# Patient Record
Sex: Male | Born: 1965 | Race: Black or African American | Hispanic: No | Marital: Married | Smoking: Never smoker
Health system: Southern US, Community
[De-identification: ages and names within clinical notes are randomized; demographics above are authoritative.]

## PROBLEM LIST (undated history)

## (undated) DIAGNOSIS — I10 Essential (primary) hypertension: Secondary | ICD-10-CM

## (undated) DIAGNOSIS — E7800 Pure hypercholesterolemia, unspecified: Secondary | ICD-10-CM

## (undated) DIAGNOSIS — E119 Type 2 diabetes mellitus without complications: Secondary | ICD-10-CM

## (undated) HISTORY — PX: TONSILLECTOMY: SUR1361

## (undated) HISTORY — PX: BACK SURGERY: SHX140

## (undated) HISTORY — PX: OTHER SURGICAL HISTORY: SHX169

---

## 2005-05-05 ENCOUNTER — Emergency Department (HOSPITAL_COMMUNITY): Admission: EM | Admit: 2005-05-05 | Discharge: 2005-05-05 | Payer: Self-pay | Admitting: Emergency Medicine

## 2007-09-23 ENCOUNTER — Emergency Department (HOSPITAL_COMMUNITY): Admission: EM | Admit: 2007-09-23 | Discharge: 2007-09-23 | Payer: Self-pay | Admitting: Emergency Medicine

## 2008-04-11 ENCOUNTER — Emergency Department (HOSPITAL_COMMUNITY): Admission: EM | Admit: 2008-04-11 | Discharge: 2008-04-11 | Payer: Self-pay | Admitting: Emergency Medicine

## 2009-09-09 ENCOUNTER — Emergency Department (HOSPITAL_COMMUNITY): Admission: EM | Admit: 2009-09-09 | Discharge: 2009-09-10 | Payer: Self-pay | Admitting: Emergency Medicine

## 2009-09-09 ENCOUNTER — Emergency Department (HOSPITAL_COMMUNITY): Admission: EM | Admit: 2009-09-09 | Discharge: 2009-09-09 | Payer: Self-pay | Admitting: Emergency Medicine

## 2010-12-24 LAB — DIFFERENTIAL
Basophils Absolute: 0.1 10*3/uL (ref 0.0–0.1)
Basophils Relative: 1 % (ref 0–1)
Eosinophils Relative: 2 % (ref 0–5)
Lymphs Abs: 2.6 10*3/uL (ref 0.7–4.0)
Monocytes Absolute: 0.6 10*3/uL (ref 0.1–1.0)
Monocytes Relative: 6 % (ref 3–12)
Neutro Abs: 6.6 10*3/uL (ref 1.7–7.7)
Neutrophils Relative %: 66 % (ref 43–77)

## 2010-12-24 LAB — CBC
MCV: 89.1 fL (ref 78.0–100.0)
Platelets: 214 10*3/uL (ref 150–400)
RBC: 5.11 MIL/uL (ref 4.22–5.81)

## 2010-12-24 LAB — URINALYSIS, ROUTINE W REFLEX MICROSCOPIC
Bilirubin Urine: NEGATIVE
Glucose, UA: NEGATIVE mg/dL
Hgb urine dipstick: NEGATIVE
Ketones, ur: NEGATIVE mg/dL
Urobilinogen, UA: 1 mg/dL (ref 0.0–1.0)
pH: 6 (ref 5.0–8.0)

## 2010-12-24 LAB — COMPREHENSIVE METABOLIC PANEL
Chloride: 109 mEq/L (ref 96–112)
GFR calc non Af Amer: >60 mL/min (ref >60)
Total Bilirubin: 0.7 mg/dL (ref 0.3–1.2)

## 2010-12-24 LAB — URINE CULTURE: Colony Count: NO GROWTH

## 2011-02-05 ENCOUNTER — Emergency Department (HOSPITAL_COMMUNITY)
Admission: EM | Admit: 2011-02-05 | Discharge: 2011-02-06 | Disposition: A | Discharge status: Home / Self Care | Payer: Medicare Other | Attending: Emergency Medicine | Admitting: Emergency Medicine

## 2011-02-05 ENCOUNTER — Emergency Department (HOSPITAL_COMMUNITY): Payer: Medicare Other

## 2011-02-05 DIAGNOSIS — Z7982 Long term (current) use of aspirin: Secondary | ICD-10-CM | POA: Insufficient documentation

## 2011-02-05 DIAGNOSIS — Z79899 Other long term (current) drug therapy: Secondary | ICD-10-CM | POA: Insufficient documentation

## 2011-02-05 DIAGNOSIS — R0989 Other specified symptoms and signs involving the circulatory and respiratory systems: Secondary | ICD-10-CM | POA: Insufficient documentation

## 2011-02-05 DIAGNOSIS — R0609 Other forms of dyspnea: Secondary | ICD-10-CM | POA: Insufficient documentation

## 2011-02-05 DIAGNOSIS — R079 Chest pain, unspecified: Secondary | ICD-10-CM | POA: Insufficient documentation

## 2011-02-05 DIAGNOSIS — E119 Type 2 diabetes mellitus without complications: Secondary | ICD-10-CM | POA: Insufficient documentation

## 2011-02-05 DIAGNOSIS — I10 Essential (primary) hypertension: Secondary | ICD-10-CM | POA: Insufficient documentation

## 2011-02-05 DIAGNOSIS — R1013 Epigastric pain: Secondary | ICD-10-CM | POA: Insufficient documentation

## 2011-02-05 DIAGNOSIS — E7800 Pure hypercholesterolemia, unspecified: Secondary | ICD-10-CM | POA: Insufficient documentation

## 2011-02-05 DIAGNOSIS — R911 Solitary pulmonary nodule: Secondary | ICD-10-CM | POA: Insufficient documentation

## 2011-02-05 LAB — COMPREHENSIVE METABOLIC PANEL
ALT: 40 U/L (ref 0–53)
AST: 26 U/L (ref 0–37)
Albumin: 4.3 g/dL (ref 3.5–5.2)
Alkaline Phosphatase: 103 U/L (ref 39–117)
CO2: 25 mEq/L (ref 19–32)
Calcium: 9 mg/dL (ref 8.4–10.5)
Chloride: 94 mEq/L — ABNORMAL LOW (ref 96–112)
GFR calc Af Amer: >60 mL/min (ref >60)
GFR calc non Af Amer: >60 mL/min (ref >60)
Sodium: 131 mEq/L — ABNORMAL LOW (ref 135–145)
Total Bilirubin: 0.5 mg/dL (ref 0.3–1.2)

## 2011-02-05 LAB — POCT CARDIAC MARKERS
Myoglobin, poc: 104 ng/mL (ref 12–200)
Troponin i, poc: <0.05 ng/mL (ref 0.00–0.09)
Troponin i, poc: <0.05 ng/mL (ref 0.00–0.09)

## 2011-02-05 LAB — CBC
Hemoglobin: 16.4 g/dL (ref 13.0–17.0)
MCH: 29.2 pg (ref 26.0–34.0)
MCV: 81.5 fL (ref 78.0–100.0)

## 2011-02-05 LAB — DIFFERENTIAL
Basophils Absolute: 0 10*3/uL (ref 0.0–0.1)
Monocytes Absolute: 0.7 10*3/uL (ref 0.1–1.0)
Neutro Abs: 8 10*3/uL — ABNORMAL HIGH (ref 1.7–7.7)

## 2011-02-05 LAB — GLUCOSE, CAPILLARY: Glucose-Capillary: 404 mg/dL — ABNORMAL HIGH (ref 70–99)

## 2011-02-05 MED ORDER — IOHEXOL 300 MG/ML  SOLN
100.0000 mL | Freq: Once | INTRAMUSCULAR | Status: AC | PRN
  Administered 2011-02-05: 100 mL via INTRAVENOUS

## 2011-02-06 ENCOUNTER — Emergency Department (HOSPITAL_COMMUNITY)
Admission: EM | Admit: 2011-02-06 | Discharge: 2011-02-06 | Disposition: A | Discharge status: Home / Self Care | Payer: Medicare Other | Attending: Emergency Medicine | Admitting: Emergency Medicine

## 2011-02-06 DIAGNOSIS — R631 Polydipsia: Secondary | ICD-10-CM | POA: Insufficient documentation

## 2011-02-06 DIAGNOSIS — R358 Other polyuria: Secondary | ICD-10-CM | POA: Insufficient documentation

## 2011-02-06 DIAGNOSIS — R112 Nausea with vomiting, unspecified: Secondary | ICD-10-CM | POA: Insufficient documentation

## 2011-02-06 DIAGNOSIS — R7309 Other abnormal glucose: Secondary | ICD-10-CM | POA: Insufficient documentation

## 2011-02-06 DIAGNOSIS — E7800 Pure hypercholesterolemia, unspecified: Secondary | ICD-10-CM | POA: Insufficient documentation

## 2011-02-06 DIAGNOSIS — R3589 Other polyuria: Secondary | ICD-10-CM | POA: Insufficient documentation

## 2011-02-06 DIAGNOSIS — E119 Type 2 diabetes mellitus without complications: Secondary | ICD-10-CM | POA: Insufficient documentation

## 2011-02-06 DIAGNOSIS — I10 Essential (primary) hypertension: Secondary | ICD-10-CM | POA: Insufficient documentation

## 2011-02-06 LAB — URINALYSIS, ROUTINE W REFLEX MICROSCOPIC
Bilirubin Urine: NEGATIVE
Hgb urine dipstick: NEGATIVE
Hgb urine dipstick: NEGATIVE
Ketones, ur: 15 mg/dL — AB
Nitrite: NEGATIVE
Protein, ur: NEGATIVE mg/dL
Protein, ur: NEGATIVE mg/dL
Specific Gravity, Urine: >1.046 — ABNORMAL HIGH (ref 1.005–1.030)
Specific Gravity, Urine: 1.038 — ABNORMAL HIGH (ref 1.005–1.030)
Urobilinogen, UA: 0.2 mg/dL (ref 0.0–1.0)
Urobilinogen, UA: 1 mg/dL (ref 0.0–1.0)
pH: 5.5 (ref 5.0–8.0)

## 2011-02-06 LAB — DIFFERENTIAL
Lymphs Abs: 2.4 10*3/uL (ref 0.7–4.0)
Monocytes Absolute: 0.6 10*3/uL (ref 0.1–1.0)
Monocytes Relative: 7 % (ref 3–12)
Neutro Abs: 5.1 10*3/uL (ref 1.7–7.7)
Neutrophils Relative %: 62 % (ref 43–77)

## 2011-02-06 LAB — BLOOD GAS, VENOUS
Acid-base deficit: 0.5 mmol/L (ref 0.0–2.0)
Bicarbonate: 23.4 mEq/L (ref 20.0–24.0)
O2 Saturation: 90.2 %
Patient temperature: 98.6
TCO2: 20.2 mmol/L (ref 0–100)

## 2011-02-06 LAB — CBC
Hemoglobin: 15.2 g/dL (ref 13.0–17.0)
MCH: 29 pg (ref 26.0–34.0)
MCHC: 35.3 g/dL (ref 30.0–36.0)
MCV: 82.1 fL (ref 78.0–100.0)
RBC: 5.25 MIL/uL (ref 4.22–5.81)

## 2011-02-06 LAB — BASIC METABOLIC PANEL
BUN: 12 mg/dL (ref 6–23)
Calcium: 9 mg/dL (ref 8.4–10.5)
Creatinine, Ser: 0.87 mg/dL (ref 0.4–1.5)
GFR calc non Af Amer: >60 mL/min (ref >60)
Glucose, Bld: 432 mg/dL — ABNORMAL HIGH (ref 70–99)

## 2011-02-06 LAB — URINE MICROSCOPIC-ADD ON

## 2011-02-06 LAB — GLUCOSE, CAPILLARY: Glucose-Capillary: 328 mg/dL — ABNORMAL HIGH (ref 70–99)

## 2011-02-07 LAB — URINE CULTURE
Culture  Setup Time: 201205170117
Culture: NO GROWTH

## 2011-02-13 ENCOUNTER — Emergency Department (HOSPITAL_COMMUNITY)
Admission: EM | Admit: 2011-02-13 | Discharge: 2011-02-14 | Disposition: A | Discharge status: Home / Self Care | Payer: Medicare Other | Attending: Emergency Medicine | Admitting: Emergency Medicine

## 2011-02-13 DIAGNOSIS — I10 Essential (primary) hypertension: Secondary | ICD-10-CM | POA: Insufficient documentation

## 2011-02-13 DIAGNOSIS — R1011 Right upper quadrant pain: Secondary | ICD-10-CM | POA: Insufficient documentation

## 2011-02-13 DIAGNOSIS — E119 Type 2 diabetes mellitus without complications: Secondary | ICD-10-CM | POA: Insufficient documentation

## 2011-02-13 DIAGNOSIS — E7800 Pure hypercholesterolemia, unspecified: Secondary | ICD-10-CM | POA: Insufficient documentation

## 2011-02-14 LAB — COMPREHENSIVE METABOLIC PANEL
Alkaline Phosphatase: 88 U/L (ref 39–117)
BUN: 11 mg/dL (ref 6–23)
CO2: 23 mEq/L (ref 19–32)
Chloride: 101 mEq/L (ref 96–112)
Glucose, Bld: 371 mg/dL — ABNORMAL HIGH (ref 70–99)
Potassium: 3.6 mEq/L (ref 3.5–5.1)
Total Bilirubin: 0.3 mg/dL (ref 0.3–1.2)

## 2011-02-14 LAB — CBC
HCT: 41.1 % (ref 39.0–52.0)
Hemoglobin: 14.5 g/dL (ref 13.0–17.0)
MCH: 29.2 pg (ref 26.0–34.0)
MCHC: 35.3 g/dL (ref 30.0–36.0)

## 2011-02-14 LAB — URINALYSIS, ROUTINE W REFLEX MICROSCOPIC
Glucose, UA: >1000 mg/dL — AB
Hgb urine dipstick: NEGATIVE
Ketones, ur: NEGATIVE mg/dL
Protein, ur: NEGATIVE mg/dL

## 2011-02-14 LAB — GLUCOSE, CAPILLARY: Glucose-Capillary: 273 mg/dL — ABNORMAL HIGH (ref 70–99)

## 2011-02-14 LAB — DIFFERENTIAL
Basophils Relative: 0 % (ref 0–1)
Eosinophils Relative: 1 % (ref 0–5)
Monocytes Absolute: 0.5 10*3/uL (ref 0.1–1.0)
Monocytes Relative: 5 % (ref 3–12)
Neutro Abs: 6 10*3/uL (ref 1.7–7.7)

## 2011-02-14 LAB — LIPASE, BLOOD: Lipase: 55 U/L (ref 11–59)

## 2011-03-12 ENCOUNTER — Other Ambulatory Visit (HOSPITAL_COMMUNITY): Payer: Self-pay | Admitting: Gastroenterology

## 2011-04-05 ENCOUNTER — Ambulatory Visit (HOSPITAL_COMMUNITY)
Admission: RE | Admit: 2011-04-05 | Discharge: 2011-04-05 | Disposition: A | Discharge status: Home / Self Care | Payer: Medicare Other | Source: Ambulatory Visit | Attending: Gastroenterology | Admitting: Gastroenterology

## 2011-04-05 DIAGNOSIS — R109 Unspecified abdominal pain: Secondary | ICD-10-CM | POA: Insufficient documentation

## 2011-04-05 MED ORDER — SINCALIDE 5 MCG IJ SOLR: 0.0200 ug/kg | Freq: Once | INTRAMUSCULAR | Status: DC

## 2011-04-05 MED ORDER — TECHNETIUM TC 99M MEBROFENIN IV KIT
5.0000 | PACK | Freq: Once | INTRAVENOUS | Status: AC | PRN
  Administered 2011-04-05: 5 via INTRAVENOUS

## 2011-06-28 LAB — I-STAT 8, (EC8 V) (CONVERTED LAB)
Acid-Base Excess: 3 — ABNORMAL HIGH
Chloride: 106
HCT: 52
Operator id: 208821
Potassium: 3.7
TCO2: 27
pH, Ven: 7.467 — ABNORMAL HIGH

## 2011-06-28 LAB — POCT CARDIAC MARKERS: Troponin i, poc: <0.05

## 2011-09-09 ENCOUNTER — Emergency Department (HOSPITAL_COMMUNITY)
Admission: EM | Admit: 2011-09-09 | Discharge: 2011-09-10 | Disposition: A | Discharge status: Home / Self Care | Payer: Medicare Other | Attending: Emergency Medicine | Admitting: Emergency Medicine

## 2011-09-09 ENCOUNTER — Encounter: Payer: Self-pay | Admitting: *Deleted

## 2011-09-09 DIAGNOSIS — M545 Low back pain, unspecified: Secondary | ICD-10-CM | POA: Insufficient documentation

## 2011-09-09 DIAGNOSIS — I10 Essential (primary) hypertension: Secondary | ICD-10-CM | POA: Insufficient documentation

## 2011-09-09 DIAGNOSIS — R1011 Right upper quadrant pain: Secondary | ICD-10-CM | POA: Insufficient documentation

## 2011-09-09 DIAGNOSIS — R109 Unspecified abdominal pain: Secondary | ICD-10-CM | POA: Insufficient documentation

## 2011-09-09 DIAGNOSIS — E7800 Pure hypercholesterolemia, unspecified: Secondary | ICD-10-CM | POA: Insufficient documentation

## 2011-09-09 HISTORY — DX: Essential (primary) hypertension: I10

## 2011-09-09 HISTORY — DX: Pure hypercholesterolemia, unspecified: E78.00

## 2011-09-10 ENCOUNTER — Emergency Department (HOSPITAL_COMMUNITY): Payer: Medicare Other

## 2011-09-10 LAB — URINALYSIS, ROUTINE W REFLEX MICROSCOPIC
Bilirubin Urine: NEGATIVE
Glucose, UA: NEGATIVE mg/dL
Ketones, ur: NEGATIVE mg/dL
Nitrite: NEGATIVE
Protein, ur: NEGATIVE mg/dL
pH: 6 (ref 5.0–8.0)

## 2011-09-10 LAB — CBC
Platelets: 226 10*3/uL (ref 150–400)
RBC: 4.97 MIL/uL (ref 4.22–5.81)
RDW: 14 % (ref 11.5–15.5)
WBC: 9.8 10*3/uL (ref 4.0–10.5)

## 2011-09-10 LAB — DIFFERENTIAL
Basophils Absolute: 0 10*3/uL (ref 0.0–0.1)
Lymphocytes Relative: 36 % (ref 12–46)
Lymphs Abs: 3.6 10*3/uL (ref 0.7–4.0)
Neutro Abs: 5.2 10*3/uL (ref 1.7–7.7)

## 2011-09-10 LAB — BASIC METABOLIC PANEL
CO2: 25 mEq/L (ref 19–32)
Calcium: 9.2 mg/dL (ref 8.4–10.5)
Chloride: 106 mEq/L (ref 96–112)
Potassium: 3.3 mEq/L — ABNORMAL LOW (ref 3.5–5.1)
Sodium: 141 mEq/L (ref 135–145)

## 2011-09-10 MED ORDER — OXYCODONE-ACETAMINOPHEN 5-325 MG PO TABS: 1.0000 | ORAL_TABLET | ORAL | Status: AC | PRN

## 2011-09-10 MED ORDER — HYDROMORPHONE HCL PF 2 MG/ML IJ SOLN
2.0000 mg | Freq: Once | INTRAMUSCULAR | Status: AC
  Administered 2011-09-10: 2 mg via INTRAVENOUS
  Filled 2011-09-10: qty 1

## 2011-09-10 MED ORDER — ONDANSETRON 8 MG PO TBDP
8.0000 mg | ORAL_TABLET | Freq: Once | ORAL | Status: AC
  Administered 2011-09-10: 8 mg via ORAL
  Filled 2011-09-10: qty 1

## 2011-09-10 NOTE — ED Notes (Signed)
Pt reports RLQ pain that began on Friday - pt admits to hx of constipation - took a stool softner this a.m. However still had to strain w/ BM. Pt denies n/v or fever.

## 2011-09-10 NOTE — ED Notes (Signed)
Rx given x1 D/c instructions reviewed w/ pt and family - pt and family deny any further questions or concerns at present.  

## 2011-09-10 NOTE — ED Provider Notes (Signed)
History     CSN: [id number] Arrival date & time: 09/09/2011 10:01 PM   First MD Initiated Contact with Patient 09/10/11 0128      Chief Complaint  Patient presents with  . Abdominal Pain    pt c/o RUQ abd pain that began on friday. reports pain is sharp in nature and with SOB with movement. Pt denies hx of same.     (Consider location/radiation/quality/duration/timing/severity/associated sxs/prior treatment) Patient is a 45 y.o. male presenting with flank pain.  Flank Pain This is a new problem. The current episode started more than 2 days ago. The problem occurs constantly. The problem has been gradually worsening. The symptoms are aggravated by twisting and walking. The symptoms are relieved by nothing. He has tried nothing for the symptoms.   the flank pain radiates to the abdomen, diffusely. He has been nauseated, but has not had any vomiting, and denies anorexia. He has no fever, or diarrhea. He admits to constipation. He Tried a stool softener without relief.   Past Medical History  Diagnosis Date  . Hypertension   . High cholesterol     Past Surgical History  Procedure Date  . Back surgery   . Tonsillectomy     History reviewed. No pertinent family history.  History  Substance Use Topics  . Smoking status: Never Smoker   . Smokeless tobacco: Not on file  . Alcohol Use: No      Review of Systems  Genitourinary: Positive for flank pain.  All other systems reviewed and are negative.    Allergies  Crestor and Lipitor  Home Medications   Current Outpatient Rx  Name Route Sig Dispense Refill  . ASPIRIN 81 MG PO CHEW Oral Chew 81 mg by mouth daily.      Marland Kitchen CINNAMON 500 MG PO TABS Oral Take 1 tablet by mouth 2 (two) times daily.      . FENTANYL 50 MCG/HR TD PT72 Transdermal Place 1 patch onto the skin every 3 (three) days.      Marland Kitchen GLIPIZIDE 10 MG PO TABS Oral Take 10 mg by mouth 2 (two) times daily before a meal.      . HYDROCHLOROTHIAZIDE 25 MG PO TABS Oral  Take 25 mg by mouth daily.      Marland Kitchen LISINOPRIL 40 MG PO TABS Oral Take 20 mg by mouth daily.      Marland Kitchen METFORMIN HCL 850 MG PO TABS Oral Take 850 mg by mouth 2 (two) times daily with a meal.      . MULTI FOR HIM 50+ PO Oral Take 1 tablet by mouth daily.      Marland Kitchen POLYVINYL ALCOHOL 1.4 % OP SOLN Both Eyes Place 1 drop into both eyes as needed. For dry eyes      . TOPIRAMATE 100 MG PO TABS Oral Take 100 mg by mouth 2 (two) times daily.      . TRIAMCINOLONE ACETONIDE 55 MCG/ACT NA INHA Nasal Place 2 sprays into the nose daily.        BP 131/70  Pulse 73  Temp(Src) 98.4 F (36.9 C) (Oral)  Resp 18  Wt 294 lb (133.358 kg)  SpO2 98%  Physical Exam  Constitutional: He is oriented to person, place, and time. He appears well-developed and well-nourished.  HENT:  Head: Normocephalic and atraumatic.  Eyes: Conjunctivae and EOM are normal. Pupils are equal, round, and reactive to light.  Neck: Normal range of motion. Neck supple.  Cardiovascular: Normal rate and regular rhythm.  Pulmonary/Chest: Effort normal and breath sounds normal.  Abdominal: Soft. Bowel sounds are normal. He exhibits no distension and no mass. There is tenderness (Diffuse, mild). There is no rebound and no guarding.  Musculoskeletal: He exhibits no edema and no tenderness.       He has right lumbar pain when moving from supine to sitting in the stretcher. He has positive straight-leg raising on the right at 15.  Neurological: He is alert and oriented to person, place, and time. No cranial nerve deficit. He exhibits normal muscle tone. Coordination normal.  Skin: Skin is warm and dry.  Psychiatric: He has a normal mood and affect. His behavior is normal. Judgment and thought content normal.    ED Course  Procedures (including critical care time)  Labs Reviewed  BASIC METABOLIC PANEL - Abnormal; Notable for the following:    Potassium 3.3 (*)    GFR calc non Af Amer 77 (*)    GFR calc Af Amer 90 (*)    All other components  within normal limits  CBC  DIFFERENTIAL  URINALYSIS, ROUTINE W REFLEX MICROSCOPIC  URINE CULTURE   Ct Abdomen Pelvis Wo Contrast  09/10/2011  *RADIOLOGY REPORT*  Clinical Data: Right lower quadrant and flank pain.  CT ABDOMEN AND PELVIS WITHOUT CONTRAST  Technique:  Multidetector CT imaging of the abdomen and pelvis was performed following the standard protocol without intravenous contrast.  Comparison: 09/09/2009  Findings: Mild right lower lobe scarring.  The lung bases are otherwise clear.  Intra-abdominal evaluation is limited without intravenous contrast. Within this limitation;  Fatty liver.  No biliary ductal dilatation.  Unremarkable spleen, pancreas, adrenal glands.  Symmetric renal size.  No hydronephrosis or hydroureter.  No urinary tract calculi.  No bowel obstruction. Colonic diverticulosis without CT evidence for diverticulitis. Normal appendix.  No lymphadenopathy.  No free intraperitoneal air or fluid.  Normal caliber vasculature.  Decompressed bladder.  Right posterior subcutaneous battery pack/stimulator, with leads extending into the spinal canal.  L5 S1 degenerative disc disease. No acute osseous abnormality.  IMPRESSION: Hepatic steatosis.  No hydronephrosis or urinary tract calculi.  Normal appendix.  Original Report Authenticated By: Waneta Martins, M.D.     1. Flank pain       MDM  Emergency room evaluations, most consistent with musculoskeletal source of his right flank to abdomen pain. No evidence for urolithiasis appendicitis or serious intra-abdominal processes. He is stable for discharge with outpatient management.        Flint Melter, MD 09/10/11 [phone]

## 2011-09-11 LAB — URINE CULTURE
Colony Count: NO GROWTH
Culture  Setup Time: 201212180959

## 2011-09-16 ENCOUNTER — Encounter (HOSPITAL_COMMUNITY): Payer: Self-pay | Admitting: *Deleted

## 2011-09-16 ENCOUNTER — Emergency Department (HOSPITAL_COMMUNITY): Payer: Medicare Other

## 2011-09-16 ENCOUNTER — Emergency Department (HOSPITAL_COMMUNITY)
Admission: EM | Admit: 2011-09-16 | Discharge: 2011-09-16 | Disposition: A | Discharge status: Home / Self Care | Payer: Medicare Other | Attending: Emergency Medicine | Admitting: Emergency Medicine

## 2011-09-16 DIAGNOSIS — M549 Dorsalgia, unspecified: Secondary | ICD-10-CM | POA: Insufficient documentation

## 2011-09-16 DIAGNOSIS — R110 Nausea: Secondary | ICD-10-CM | POA: Insufficient documentation

## 2011-09-16 DIAGNOSIS — R141 Gas pain: Secondary | ICD-10-CM | POA: Insufficient documentation

## 2011-09-16 DIAGNOSIS — I10 Essential (primary) hypertension: Secondary | ICD-10-CM | POA: Insufficient documentation

## 2011-09-16 DIAGNOSIS — R1031 Right lower quadrant pain: Secondary | ICD-10-CM | POA: Insufficient documentation

## 2011-09-16 DIAGNOSIS — E7800 Pure hypercholesterolemia, unspecified: Secondary | ICD-10-CM | POA: Insufficient documentation

## 2011-09-16 DIAGNOSIS — R5381 Other malaise: Secondary | ICD-10-CM | POA: Insufficient documentation

## 2011-09-16 DIAGNOSIS — R142 Eructation: Secondary | ICD-10-CM | POA: Insufficient documentation

## 2011-09-16 LAB — URINALYSIS, ROUTINE W REFLEX MICROSCOPIC
Glucose, UA: NEGATIVE mg/dL
Hgb urine dipstick: NEGATIVE
Ketones, ur: NEGATIVE mg/dL
Protein, ur: NEGATIVE mg/dL
Urobilinogen, UA: 0.2 mg/dL (ref 0.0–1.0)

## 2011-09-16 LAB — POCT I-STAT, CHEM 8
BUN: 18 mg/dL (ref 6–23)
Calcium, Ion: 1.1 mmol/L — ABNORMAL LOW (ref 1.12–1.32)
Creatinine, Ser: 1.1 mg/dL (ref 0.50–1.35)
Hemoglobin: 15 g/dL (ref 13.0–17.0)
Sodium: 143 mEq/L (ref 135–145)
TCO2: 23 mmol/L (ref 0–100)

## 2011-09-16 LAB — DIFFERENTIAL
Eosinophils Relative: 3 % (ref 0–5)
Lymphocytes Relative: 28 % (ref 12–46)
Lymphs Abs: 2.7 10*3/uL (ref 0.7–4.0)
Monocytes Relative: 6 % (ref 3–12)

## 2011-09-16 LAB — CBC
HCT: 42.3 % (ref 39.0–52.0)
Hemoglobin: 14.9 g/dL (ref 13.0–17.0)
MCV: 82.9 fL (ref 78.0–100.0)
Platelets: 224 10*3/uL (ref 150–400)
RBC: 5.1 MIL/uL (ref 4.22–5.81)
WBC: 9.7 10*3/uL (ref 4.0–10.5)

## 2011-09-16 LAB — LACTIC ACID, PLASMA: Lactic Acid, Venous: 1.1 mmol/L (ref 0.5–2.2)

## 2011-09-16 MED ORDER — OXYCODONE-ACETAMINOPHEN 5-325 MG PO TABS: 1.0000 | ORAL_TABLET | Freq: Four times a day (QID) | ORAL | Status: AC | PRN

## 2011-09-16 MED ORDER — HYDROMORPHONE HCL PF 1 MG/ML IJ SOLN
1.0000 mg | Freq: Once | INTRAMUSCULAR | Status: AC
  Administered 2011-09-16: 1 mg via INTRAVENOUS
  Filled 2011-09-16: qty 1

## 2011-09-16 MED ORDER — ONDANSETRON HCL 4 MG/2ML IJ SOLN
4.0000 mg | Freq: Once | INTRAMUSCULAR | Status: AC
  Administered 2011-09-16: 4 mg via INTRAVENOUS
  Filled 2011-09-16: qty 2

## 2011-09-16 MED ORDER — DIAZEPAM 5 MG/ML IJ SOLN
5.0000 mg | Freq: Once | INTRAMUSCULAR | Status: AC
  Administered 2011-09-16: 5 mg via INTRAVENOUS
  Filled 2011-09-16: qty 2

## 2011-09-16 MED ORDER — SODIUM CHLORIDE 0.9 % IV SOLN
Freq: Once | INTRAVENOUS | Status: AC
  Administered 2011-09-16: 21:00:00 via INTRAVENOUS

## 2011-09-16 MED ORDER — DIAZEPAM 5 MG PO TABS: 5.0000 mg | ORAL_TABLET | Freq: Two times a day (BID) | ORAL | Status: AC

## 2011-09-16 NOTE — ED Provider Notes (Signed)
Medical screening examination/treatment/procedure(s) were conducted as a shared visit with non-physician practitioner(s) and myself.  I personally evaluated the patient during the encounter Following ED interventions, placement of girdle, the patient noted significant improvement in his pain.  D/C home in stable condition.  Gerhard Munch, MD 09/16/11 [phone]
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
## 2011-09-16 NOTE — ED Notes (Signed)
Rx given to pt. 

## 2011-09-16 NOTE — ED Notes (Signed)
ED PA at bedside

## 2011-09-16 NOTE — ED Notes (Signed)
Pt began to feel pain in his right flank area last Friday for which he came to [facility] and was diagnosed with a "strained flank muscle" and a CT ruled out any other abnormalities.  Pt returns tonight due to increased pain.  Pt states that he feels nauseated but has not vomited, stating that he has experienced some very soft stool but attributes it to the lactulose that he was instructed to take.

## 2012-11-07 ENCOUNTER — Encounter (HOSPITAL_COMMUNITY): Payer: Self-pay | Admitting: *Deleted

## 2012-11-07 ENCOUNTER — Emergency Department (HOSPITAL_COMMUNITY)
Admission: EM | Admit: 2012-11-07 | Discharge: 2012-11-08 | Disposition: A | Discharge status: Home / Self Care | Payer: Medicare Other | Attending: Emergency Medicine | Admitting: Emergency Medicine

## 2012-11-07 ENCOUNTER — Emergency Department (HOSPITAL_COMMUNITY): Payer: Medicare Other

## 2012-11-07 DIAGNOSIS — R109 Unspecified abdominal pain: Secondary | ICD-10-CM

## 2012-11-07 DIAGNOSIS — Z79899 Other long term (current) drug therapy: Secondary | ICD-10-CM | POA: Insufficient documentation

## 2012-11-07 DIAGNOSIS — IMO0002 Reserved for concepts with insufficient information to code with codable children: Secondary | ICD-10-CM | POA: Insufficient documentation

## 2012-11-07 DIAGNOSIS — E7800 Pure hypercholesterolemia, unspecified: Secondary | ICD-10-CM | POA: Insufficient documentation

## 2012-11-07 DIAGNOSIS — I10 Essential (primary) hypertension: Secondary | ICD-10-CM | POA: Insufficient documentation

## 2012-11-07 DIAGNOSIS — R1031 Right lower quadrant pain: Secondary | ICD-10-CM | POA: Insufficient documentation

## 2012-11-07 DIAGNOSIS — R110 Nausea: Secondary | ICD-10-CM | POA: Insufficient documentation

## 2012-11-07 DIAGNOSIS — R0602 Shortness of breath: Secondary | ICD-10-CM | POA: Insufficient documentation

## 2012-11-07 DIAGNOSIS — Z7982 Long term (current) use of aspirin: Secondary | ICD-10-CM | POA: Insufficient documentation

## 2012-11-07 LAB — URINALYSIS, ROUTINE W REFLEX MICROSCOPIC
Glucose, UA: NEGATIVE mg/dL
Ketones, ur: NEGATIVE mg/dL
Leukocytes, UA: NEGATIVE
Nitrite: NEGATIVE
Specific Gravity, Urine: 1.026 (ref 1.005–1.030)
pH: 6 (ref 5.0–8.0)

## 2012-11-07 LAB — CBC WITH DIFFERENTIAL/PLATELET
Basophils Absolute: 0 10*3/uL (ref 0.0–0.1)
Basophils Relative: 0 % (ref 0–1)
Eosinophils Absolute: 0.1 10*3/uL (ref 0.0–0.7)
MCH: 29.7 pg (ref 26.0–34.0)
MCHC: 35.1 g/dL (ref 30.0–36.0)
Monocytes Relative: 4 % (ref 3–12)
Neutro Abs: 8.5 10*3/uL — ABNORMAL HIGH (ref 1.7–7.7)
Neutrophils Relative %: 73 % (ref 43–77)
Platelets: 263 10*3/uL (ref 150–400)
RDW: 13.2 % (ref 11.5–15.5)

## 2012-11-07 LAB — COMPREHENSIVE METABOLIC PANEL
AST: 17 U/L (ref 0–37)
Albumin: 3.8 g/dL (ref 3.5–5.2)
Alkaline Phosphatase: 87 U/L (ref 39–117)
BUN: 22 mg/dL (ref 6–23)
Chloride: 103 mEq/L (ref 96–112)
Creatinine, Ser: 1.14 mg/dL (ref 0.50–1.35)
Potassium: 3.5 mEq/L (ref 3.5–5.1)
Total Bilirubin: 0.3 mg/dL (ref 0.3–1.2)
Total Protein: 7.5 g/dL (ref 6.0–8.3)

## 2012-11-07 LAB — LIPASE, BLOOD: Lipase: 29 U/L (ref 11–59)

## 2012-11-07 MED ORDER — SODIUM CHLORIDE 0.9 % IV BOLUS (SEPSIS)
1000.0000 mL | Freq: Once | INTRAVENOUS | Status: AC
  Administered 2012-11-07: 1000 mL via INTRAVENOUS

## 2012-11-07 MED ORDER — HYDROMORPHONE HCL PF 1 MG/ML IJ SOLN
1.0000 mg | Freq: Once | INTRAMUSCULAR | Status: AC
  Administered 2012-11-08: 1 mg via INTRAVENOUS
  Filled 2012-11-07: qty 1

## 2012-11-07 MED ORDER — OXYCODONE-ACETAMINOPHEN 5-325 MG PO TABS: 2.0000 | ORAL_TABLET | ORAL | Status: DC | PRN

## 2012-11-07 MED ORDER — PROMETHAZINE HCL 25 MG PO TABS: 25.0000 mg | ORAL_TABLET | Freq: Four times a day (QID) | ORAL | Status: DC | PRN

## 2012-11-07 MED ORDER — ONDANSETRON HCL 4 MG/2ML IJ SOLN
4.0000 mg | Freq: Once | INTRAMUSCULAR | Status: AC
  Administered 2012-11-07: 4 mg via INTRAVENOUS
  Filled 2012-11-07: qty 2

## 2012-11-07 MED ORDER — HYDROMORPHONE HCL PF 1 MG/ML IJ SOLN
1.0000 mg | Freq: Once | INTRAMUSCULAR | Status: AC
  Administered 2012-11-07: 1 mg via INTRAVENOUS
  Filled 2012-11-07: qty 1

## 2012-11-07 MED ORDER — IOHEXOL 300 MG/ML  SOLN
100.0000 mL | Freq: Once | INTRAMUSCULAR | Status: AC | PRN
  Administered 2012-11-07: 100 mL via INTRAVENOUS

## 2012-11-07 MED ORDER — IOHEXOL 300 MG/ML  SOLN
50.0000 mL | Freq: Once | INTRAMUSCULAR | Status: AC | PRN
  Administered 2012-11-07: 50 mL via ORAL

## 2012-11-07 NOTE — ED Notes (Signed)
Patient given urinal to obtain specimen.

## 2012-11-07 NOTE — ED Notes (Signed)
Per pt report: Pt c/o of sharp pain in the right lower abd.  Pt endorses nausea but has not vomiting.  Pt also c/o of SOB with O2 sats at 95% on RA.  Pt reports pain has been going on for about a week but become unbearable today. Pt a/o x 4. Pt goes back and forth between using a wheelchair or a walker but is in a wheelchair today.

## 2012-11-11 NOTE — ED Provider Notes (Signed)
History     CSN: [id number]  Arrival date & time 11/07/12  1909   First MD Initiated Contact with Patient 11/07/12 1956      No chief complaint on file.   (Consider location/radiation/quality/duration/timing/severity/associated sxs/prior treatment) HPI.... Sharp right lower quadrant pain for one week with associated nausea but no vomiting. Also complains of shortness of breath nothing makes symptoms better or worse. Severity is mild to moderate. He has been eating. Normal urination and bowel movements.  no fever sweats or chills or dysuria  Past Medical History  Diagnosis Date  . Hypertension   . High cholesterol     Past Surgical History  Procedure Laterality Date  . Tonsillectomy    . Back surgery      in 2004, stimulator installed  . Arm surgery      distal left arm ulnar and radial break    Family History  Problem Relation Age of Onset  . Rheum arthritis Mother   . Diabetes Mother   . Hypertension Mother   . Cancer Father   . Diabetes Father   . Hypertension Father     History  Substance Use Topics  . Smoking status: Never Smoker   . Smokeless tobacco: Not on file  . Alcohol Use: No      Review of Systems  All other systems reviewed and are negative.    Allergies  Crestor and Lipitor  Home Medications   Current Outpatient Rx  Name  Route  Sig  Dispense  Refill  . aspirin 81 MG chewable tablet   Oral   Chew 81 mg by mouth daily.           . Cinnamon 500 MG TABS   Oral   Take 1 tablet by mouth 2 (two) times daily.           . fentaNYL (DURAGESIC - DOSED MCG/HR) 50 MCG/HR   Transdermal   Place 1 patch onto the skin every 3 (three) days.           . fluticasone (FLONASE) 50 MCG/ACT nasal spray   Nasal   Place 2 sprays into the nose daily.         Marland Kitchen glipiZIDE (GLUCOTROL) 10 MG tablet   Oral   Take 10 mg by mouth 2 (two) times daily before a meal.           . hydrochlorothiazide (HYDRODIURIL) 25 MG tablet   Oral   Take 25 mg  by mouth daily.           Marland Kitchen lisinopril (PRINIVIL,ZESTRIL) 40 MG tablet   Oral   Take 20 mg by mouth daily.           . Multiple Vitamins-Minerals (MULTI FOR HIM 50+ PO)   Oral   Take 1 tablet by mouth daily.           . polyvinyl alcohol (LIQUIFILM TEARS) 1.4 % ophthalmic solution   Both Eyes   Place 1 drop into both eyes as needed. For dry eyes           . topiramate (TOPAMAX) 100 MG tablet   Oral   Take 100 mg by mouth 2 (two) times daily.           Marland Kitchen oxyCODONE-acetaminophen (PERCOCET) 5-325 MG per tablet   Oral   Take 2 tablets by mouth every 4 (four) hours as needed for pain.   20 tablet   0   . promethazine (PHENERGAN) 25 MG  tablet   Oral   Take 1 tablet (25 mg total) by mouth every 6 (six) hours as needed for nausea.   20 tablet   0     BP 101/63  Pulse 74  Temp(Src) 97.5 F (36.4 C) (Oral)  Resp 18  Ht 6' (1.829 m)  Wt 285 lb (129.275 kg)  BMI 38.64 kg/m2  SpO2 97%  Physical Exam  Nursing note and vitals reviewed. Constitutional: He is oriented to person, place, and time. He appears well-developed and well-nourished.  HENT:  Head: Normocephalic and atraumatic.  Eyes: Conjunctivae and EOM are normal. Pupils are equal, round, and reactive to light.  Neck: Normal range of motion. Neck supple.  Cardiovascular: Normal rate, regular rhythm and normal heart sounds.   Pulmonary/Chest: Effort normal and breath sounds normal.  Minimal right lower quadrant tenderness.  Abdominal: Soft. Bowel sounds are normal.  Musculoskeletal: Normal range of motion.  Neurological: He is alert and oriented to person, place, and time.  Skin: Skin is warm and dry.  Psychiatric: He has a normal mood and affect.    ED Course  Procedures (including critical care time)  Labs Reviewed  CBC WITH DIFFERENTIAL - Abnormal; Notable for the following:    WBC 11.7 (*)    Neutro Abs 8.5 (*)    All other components within normal limits  COMPREHENSIVE METABOLIC PANEL -  Abnormal; Notable for the following:    Glucose, Bld 110 (*)    GFR calc non Af Amer 76 (*)    GFR calc Af Amer 88 (*)    All other components within normal limits  URINALYSIS, ROUTINE W REFLEX MICROSCOPIC - Abnormal; Notable for the following:    APPearance CLOUDY (*)    All other components within normal limits  LIPASE, BLOOD   No results found. No results found.  1. Abdominal pain       MDM    CT scan of abdomen shows no appendicitis or other acute findings. Vital signs are normal.      Donnetta Hutching, MD 11/11/12 [phone]

## 2013-09-03 ENCOUNTER — Emergency Department (HOSPITAL_COMMUNITY): Payer: Medicare Other

## 2013-09-03 ENCOUNTER — Emergency Department (HOSPITAL_COMMUNITY)
Admission: EM | Admit: 2013-09-03 | Discharge: 2013-09-03 | Disposition: A | Discharge status: Home / Self Care | Payer: Medicare Other | Attending: Emergency Medicine | Admitting: Emergency Medicine

## 2013-09-03 ENCOUNTER — Encounter (HOSPITAL_COMMUNITY): Payer: Self-pay | Admitting: Emergency Medicine

## 2013-09-03 DIAGNOSIS — K7689 Other specified diseases of liver: Secondary | ICD-10-CM | POA: Insufficient documentation

## 2013-09-03 DIAGNOSIS — Z8639 Personal history of other endocrine, nutritional and metabolic disease: Secondary | ICD-10-CM | POA: Insufficient documentation

## 2013-09-03 DIAGNOSIS — I10 Essential (primary) hypertension: Secondary | ICD-10-CM | POA: Insufficient documentation

## 2013-09-03 DIAGNOSIS — R7309 Other abnormal glucose: Secondary | ICD-10-CM | POA: Insufficient documentation

## 2013-09-03 DIAGNOSIS — K760 Fatty (change of) liver, not elsewhere classified: Secondary | ICD-10-CM

## 2013-09-03 DIAGNOSIS — Z79899 Other long term (current) drug therapy: Secondary | ICD-10-CM | POA: Insufficient documentation

## 2013-09-03 DIAGNOSIS — Z7982 Long term (current) use of aspirin: Secondary | ICD-10-CM | POA: Insufficient documentation

## 2013-09-03 DIAGNOSIS — IMO0002 Reserved for concepts with insufficient information to code with codable children: Secondary | ICD-10-CM | POA: Insufficient documentation

## 2013-09-03 DIAGNOSIS — R3989 Other symptoms and signs involving the genitourinary system: Secondary | ICD-10-CM | POA: Insufficient documentation

## 2013-09-03 DIAGNOSIS — R739 Hyperglycemia, unspecified: Secondary | ICD-10-CM

## 2013-09-03 DIAGNOSIS — Z862 Personal history of diseases of the blood and blood-forming organs and certain disorders involving the immune mechanism: Secondary | ICD-10-CM | POA: Insufficient documentation

## 2013-09-03 DIAGNOSIS — K567 Ileus, unspecified: Secondary | ICD-10-CM

## 2013-09-03 DIAGNOSIS — K560 Paralytic ileus: Secondary | ICD-10-CM | POA: Insufficient documentation

## 2013-09-03 DIAGNOSIS — R110 Nausea: Secondary | ICD-10-CM | POA: Insufficient documentation

## 2013-09-03 LAB — CBC WITH DIFFERENTIAL/PLATELET
HCT: 43.3 % (ref 39.0–52.0)
Hemoglobin: 15.8 g/dL (ref 13.0–17.0)
Lymphocytes Relative: 25 % (ref 12–46)
Lymphs Abs: 2 10*3/uL (ref 0.7–4.0)
Monocytes Absolute: 0.4 10*3/uL (ref 0.1–1.0)
Monocytes Relative: 5 % (ref 3–12)
Neutro Abs: 5.7 10*3/uL (ref 1.7–7.7)
Neutrophils Relative %: 69 % (ref 43–77)
RBC: 5.32 MIL/uL (ref 4.22–5.81)

## 2013-09-03 LAB — URINALYSIS, ROUTINE W REFLEX MICROSCOPIC
Glucose, UA: >1000 mg/dL — AB
Ketones, ur: NEGATIVE mg/dL
Leukocytes, UA: NEGATIVE
Protein, ur: NEGATIVE mg/dL
Urobilinogen, UA: 0.2 mg/dL (ref 0.0–1.0)

## 2013-09-03 LAB — COMPREHENSIVE METABOLIC PANEL
Albumin: 4.2 g/dL (ref 3.5–5.2)
Alkaline Phosphatase: 121 U/L — ABNORMAL HIGH (ref 39–117)
BUN: 17 mg/dL (ref 6–23)
CO2: 20 mEq/L (ref 19–32)
Chloride: 102 mEq/L (ref 96–112)
Creatinine, Ser: 1 mg/dL (ref 0.50–1.35)
GFR calc Af Amer: >90 mL/min (ref >90)
GFR calc non Af Amer: 89 mL/min — ABNORMAL LOW (ref >90)
Glucose, Bld: 348 mg/dL — ABNORMAL HIGH (ref 70–99)
Potassium: 3.6 mEq/L (ref 3.5–5.1)
Total Bilirubin: 0.6 mg/dL (ref 0.3–1.2)

## 2013-09-03 LAB — GLUCOSE, CAPILLARY

## 2013-09-03 MED ORDER — SODIUM CHLORIDE 0.9 % IV BOLUS (SEPSIS)
1000.0000 mL | Freq: Once | INTRAVENOUS | Status: AC
  Administered 2013-09-03: 1000 mL via INTRAVENOUS

## 2013-09-03 MED ORDER — HYDROMORPHONE HCL PF 1 MG/ML IJ SOLN
1.0000 mg | Freq: Once | INTRAMUSCULAR | Status: AC
  Administered 2013-09-03: 1 mg via INTRAVENOUS
  Filled 2013-09-03: qty 1

## 2013-09-03 MED ORDER — METFORMIN HCL 500 MG PO TABS: 500.0000 mg | ORAL_TABLET | Freq: Two times a day (BID) | ORAL | Status: DC

## 2013-09-03 MED ORDER — IBUPROFEN 800 MG PO TABS: 800.0000 mg | ORAL_TABLET | Freq: Three times a day (TID) | ORAL | Status: DC

## 2013-09-03 MED ORDER — KETOROLAC TROMETHAMINE 30 MG/ML IJ SOLN
30.0000 mg | Freq: Once | INTRAMUSCULAR | Status: AC
  Administered 2013-09-03: 30 mg via INTRAVENOUS
  Filled 2013-09-03: qty 1

## 2013-09-03 MED ORDER — ONDANSETRON HCL 4 MG/2ML IJ SOLN
4.0000 mg | Freq: Once | INTRAMUSCULAR | Status: AC
  Administered 2013-09-03: 4 mg via INTRAVENOUS
  Filled 2013-09-03: qty 2

## 2013-09-03 MED ORDER — METOCLOPRAMIDE HCL 10 MG PO TABS: 10.0000 mg | ORAL_TABLET | Freq: Four times a day (QID) | ORAL | Status: AC | PRN

## 2013-09-03 MED ORDER — SODIUM CHLORIDE 0.9 % IV BOLUS (SEPSIS): 1000.0000 mL | Freq: Once | INTRAVENOUS | Status: DC

## 2013-09-03 NOTE — ED Provider Notes (Addendum)
CSN: [id number]     Arrival date & time 09/03/13  1736 History   First MD Initiated Contact with Patient 09/03/13 1758     Chief Complaint  Patient presents with  . Abdominal Pain  . dark urine    (Consider location/radiation/quality/duration/timing/severity/associated sxs/prior Treatment) The history is provided by the patient.  CARLA WHILDEN is a 47 y.o. male hx of HTN, HL here with abdominal pain and dark urine. Diffuse abdominal pain worse in the upper abdomen for the last several weeks. Pain is intermittent. It is sometimes worse with food. Feels nauseous but denies any vomiting. He had worsening pain yesterday. Also noted darker urine but denies dysuria. He had normal CT abdomen pelvis several months ago. He also was diagnosed with IBS previously. Denies any abdominal surgeries.    Past Medical History  Diagnosis Date  . Hypertension   . High cholesterol    Past Surgical History  Procedure Laterality Date  . Tonsillectomy    . Back surgery      in 2004, stimulator installed  . Arm surgery      distal left arm ulnar and radial break   Family History  Problem Relation Age of Onset  . Rheum arthritis Mother   . Diabetes Mother   . Hypertension Mother   . Cancer Father   . Diabetes Father   . Hypertension Father    History  Substance Use Topics  . Smoking status: Never Smoker   . Smokeless tobacco: Not on file  . Alcohol Use: No    Review of Systems  Gastrointestinal: Positive for nausea and abdominal pain.  All other systems reviewed and are negative.    Allergies  Crestor and Lipitor  Home Medications   Current Outpatient Rx  Name  Route  Sig  Dispense  Refill  . aspirin 81 MG chewable tablet   Oral   Chew 81 mg by mouth daily.           . Cinnamon 500 MG TABS   Oral   Take 1 tablet by mouth 2 (two) times daily.           . fluticasone (FLONASE) 50 MCG/ACT nasal spray   Nasal   Place 2 sprays into the nose daily.         Marland Kitchen glipiZIDE  (GLUCOTROL) 10 MG tablet   Oral   Take 10 mg by mouth 2 (two) times daily before a meal.           . hydrochlorothiazide (HYDRODIURIL) 25 MG tablet   Oral   Take 25 mg by mouth daily.           Marland Kitchen lisinopril (PRINIVIL,ZESTRIL) 40 MG tablet   Oral   Take 20 mg by mouth daily.           . Multiple Vitamins-Minerals (MULTI FOR HIM 50+ PO)   Oral   Take 1 tablet by mouth daily.           Marland Kitchen omeprazole (PRILOSEC) 20 MG capsule   Oral   Take 20 mg by mouth 2 (two) times daily before a meal.         . oxyCODONE (OXY IR/ROXICODONE) 5 MG immediate release tablet   Oral   Take 5 mg by mouth 3 (three) times daily as needed for severe pain (pain).         . polyvinyl alcohol (LIQUIFILM TEARS) 1.4 % ophthalmic solution   Both Eyes   Place 1 drop into  both eyes as needed. For dry eyes           . promethazine (PHENERGAN) 25 MG tablet   Oral   Take 1 tablet (25 mg total) by mouth every 6 (six) hours as needed for nausea.   20 tablet   0   . topiramate (TOPAMAX) 100 MG tablet   Oral   Take 100 mg by mouth 2 (two) times daily.            BP 132/93  Pulse 86  Resp 18  SpO2 99% Physical Exam  Nursing note and vitals reviewed. Constitutional: He is oriented to person, place, and time. He appears well-developed and well-nourished.  HENT:  Head: Normocephalic.  Mouth/Throat: Oropharynx is clear and moist.  Eyes: Conjunctivae are normal. Pupils are equal, round, and reactive to light.  Neck: Normal range of motion. Neck supple.  Cardiovascular: Normal rate, regular rhythm and normal heart sounds.   Pulmonary/Chest: Effort normal and breath sounds normal. No respiratory distress. He has no wheezes. He has no rales.  Abdominal: Soft.  + RUQ tenderness, no rebound. No CVAT   Musculoskeletal: Normal range of motion. He exhibits no edema and no tenderness.  Neurological: He is alert and oriented to person, place, and time.  Skin: Skin is warm and dry.  Psychiatric: He has  a normal mood and affect. His behavior is normal. Judgment and thought content normal.    ED Course  Procedures (including critical care time) Labs Review Labs Reviewed  CBC WITH DIFFERENTIAL - Abnormal; Notable for the following:    MCHC 36.5 (*)    All other components within normal limits  COMPREHENSIVE METABOLIC PANEL - Abnormal; Notable for the following:    Glucose, Bld 348 (*)    Alkaline Phosphatase 121 (*)    GFR calc non Af Amer 89 (*)    All other components within normal limits  URINALYSIS, ROUTINE W REFLEX MICROSCOPIC - Abnormal; Notable for the following:    Glucose, UA >1000 (*)    All other components within normal limits  GLUCOSE, CAPILLARY - Abnormal; Notable for the following:    Glucose-Capillary 262 (*)    All other components within normal limits  GLUCOSE, CAPILLARY - Abnormal; Notable for the following:    Glucose-Capillary 275 (*)    All other components within normal limits  LIPASE, BLOOD  URINE MICROSCOPIC-ADD ON   Imaging Review US Abdomen Complete  09/03/2013   CLINICAL DATA:  Upper abdominal pain.  Nausea.  EXAM: ULTRASOUND ABDOMEN COMPLETE  COMPARISON:  Abdominal ultrasound 02/05/2011. CT abdomen and pelvis 09/10/2011, 2/15/1,014.  FINDINGS: Gallbladder:  Contracted without shadowing gallstones or echogenic sludge. No gallbladder wall thickening or pericholecystic fluid. Negative sonographic Murphy sign according to the ultrasound technologist.  Common bile duct:  Diameter: Not visualized due to the extreme echodensity of the liver and duodenal bowel gas.  Liver:  Markedly increased and coarsened echotexture which only allowed visualization of portions of the liver. No focal hepatic parenchymal abnormality involving the visualized liver. Portal vein not visualized.  IVC:  Not visualized due to midline abdominal bowel gas.  Pancreas:  Not visualized due to midline abdominal bowel gas.  Spleen:  Normal size and echotexture without focal parenchymal  abnormality.  Right Kidney:  Length: Approximately 11.8 cm. No hydronephrosis. Well-preserved cortex. No shadowing calculi. Normal parenchymal echotexture without focal abnormalities.  Left Kidney:  Length: Approximately 11.9 cm. No hydronephrosis. Well-preserved cortex. No shadowing calculi. Normal parenchymal echotexture without focal abnormalities.  Abdominal  aorta:  Not visualized due to midline bowel gas.  Other findings:  None.  IMPRESSION: 1. Technically difficult examination due to body habitus and abundant bowel gas. Nonvisualization of the IVC, aorta, and pancreas. 2. Severe diffuse hepatic steatosis and/or hepatocellular disease. Due to the echodensity of the liver, only portions of the liver were visualized and the portal vein and common bile duct were obscured. No focal parenchymal abnormality involving the visualized liver. 3. Contracted gallbladder without evidence of cholelithiasis or acute cholecystitis.   Electronically Signed   By: Hulan Saas M.D.   On: 09/03/2013 20:04   Dg Abd 2 Views  09/03/2013   CLINICAL DATA:  Right-sided pain.  EXAM: ABDOMEN - 2 VIEW  COMPARISON:  Ultrasound of the abdomen performed earlier today. CT abdomen 11/07/2012  FINDINGS: Increased body habitus. No obstruction or free air. Mildly prominent jejunal bowel loops. No air-fluid levels. Dorsal column stimulator. No osseous lesions. No abnormal calcifications.  IMPRESSION: Mildly prominent left upper quadrant small bowel loops without thickening or air-fluid levels. Localized ileus not excluded.   Electronically Signed   By: Davonna Belling M.D.   On: 09/03/2013 20:22    EKG Interpretation   None       MDM  No diagnosis found. EZRI LANDERS is a 47 y.o. male here with ab pain. Likely reflux vs ulcer vs gallstones. Will get RUQ [place] and labs. Will reassess after pain meds.   10:10 PM US showed hepatic steatosis. Xray showed possible LUQ ileus. He is also hyperglycemic to 350, improved with IVF. No signs  of DKA. Able to tolerate fluids. I think he has mild ileus due to pain meds use. Recommend d/c narcotics, take tylenol or motrin, hydration, start metformin 500 mg BID.   Richardean Canal, MD 09/03/13 2213  Richardean Canal, MD 09/03/13 2214

## 2013-09-03 NOTE — ED Notes (Signed)
Pt c/o lower abd pain and dark colored urine that started night before last. Pt states nausea but denies v/d or trouble urinating.

## 2013-09-03 NOTE — ED Notes (Signed)
Patient transported to X-ray 

## 2013-11-01 ENCOUNTER — Emergency Department (HOSPITAL_COMMUNITY)
Admission: EM | Admit: 2013-11-01 | Discharge: 2013-11-02 | Disposition: A | Discharge status: Home / Self Care | Payer: Medicare Other | Attending: Emergency Medicine | Admitting: Emergency Medicine

## 2013-11-01 ENCOUNTER — Encounter (HOSPITAL_COMMUNITY): Payer: Self-pay | Admitting: Emergency Medicine

## 2013-11-01 DIAGNOSIS — Z7982 Long term (current) use of aspirin: Secondary | ICD-10-CM | POA: Insufficient documentation

## 2013-11-01 DIAGNOSIS — E119 Type 2 diabetes mellitus without complications: Secondary | ICD-10-CM | POA: Insufficient documentation

## 2013-11-01 DIAGNOSIS — Z79899 Other long term (current) drug therapy: Secondary | ICD-10-CM | POA: Insufficient documentation

## 2013-11-01 DIAGNOSIS — R519 Headache, unspecified: Secondary | ICD-10-CM | POA: Diagnosis present

## 2013-11-01 DIAGNOSIS — Y9314 Activity, water aerobics and water exercise: Secondary | ICD-10-CM | POA: Insufficient documentation

## 2013-11-01 DIAGNOSIS — IMO0002 Reserved for concepts with insufficient information to code with codable children: Secondary | ICD-10-CM | POA: Insufficient documentation

## 2013-11-01 DIAGNOSIS — Y92838 Other recreation area as the place of occurrence of the external cause: Secondary | ICD-10-CM

## 2013-11-01 DIAGNOSIS — X500XXA Overexertion from strenuous movement or load, initial encounter: Secondary | ICD-10-CM | POA: Insufficient documentation

## 2013-11-01 DIAGNOSIS — G8929 Other chronic pain: Secondary | ICD-10-CM | POA: Insufficient documentation

## 2013-11-01 DIAGNOSIS — R739 Hyperglycemia, unspecified: Secondary | ICD-10-CM | POA: Diagnosis present

## 2013-11-01 DIAGNOSIS — H53149 Visual discomfort, unspecified: Secondary | ICD-10-CM | POA: Insufficient documentation

## 2013-11-01 DIAGNOSIS — S161XXA Strain of muscle, fascia and tendon at neck level, initial encounter: Secondary | ICD-10-CM | POA: Diagnosis present

## 2013-11-01 DIAGNOSIS — R51 Headache: Secondary | ICD-10-CM | POA: Insufficient documentation

## 2013-11-01 DIAGNOSIS — Z862 Personal history of diseases of the blood and blood-forming organs and certain disorders involving the immune mechanism: Secondary | ICD-10-CM | POA: Insufficient documentation

## 2013-11-01 DIAGNOSIS — S139XXA Sprain of joints and ligaments of unspecified parts of neck, initial encounter: Secondary | ICD-10-CM | POA: Insufficient documentation

## 2013-11-01 DIAGNOSIS — I10 Essential (primary) hypertension: Secondary | ICD-10-CM | POA: Insufficient documentation

## 2013-11-01 DIAGNOSIS — Y9239 Other specified sports and athletic area as the place of occurrence of the external cause: Secondary | ICD-10-CM | POA: Insufficient documentation

## 2013-11-01 DIAGNOSIS — Z8639 Personal history of other endocrine, nutritional and metabolic disease: Secondary | ICD-10-CM | POA: Insufficient documentation

## 2013-11-01 LAB — URINALYSIS, ROUTINE W REFLEX MICROSCOPIC
Bilirubin Urine: NEGATIVE
Glucose, UA: 250 mg/dL — AB
Hgb urine dipstick: NEGATIVE
Ketones, ur: NEGATIVE mg/dL
Leukocytes, UA: NEGATIVE
Nitrite: NEGATIVE
Protein, ur: NEGATIVE mg/dL
Specific Gravity, Urine: 1.019 (ref 1.005–1.030)
Urobilinogen, UA: 0.2 mg/dL (ref 0.0–1.0)
pH: 5.5 (ref 5.0–8.0)

## 2013-11-01 LAB — CBC
HCT: 45.2 % (ref 39.0–52.0)
Hemoglobin: 16.1 g/dL (ref 13.0–17.0)
MCH: 29.4 pg (ref 26.0–34.0)
MCHC: 35.6 g/dL (ref 30.0–36.0)
MCV: 82.6 fL (ref 78.0–100.0)
PLATELETS: 232 10*3/uL (ref 150–400)
RBC: 5.47 MIL/uL (ref 4.22–5.81)
RDW: 13.1 % (ref 11.5–15.5)
WBC: 9 10*3/uL (ref 4.0–10.5)

## 2013-11-01 LAB — COMPREHENSIVE METABOLIC PANEL
ALBUMIN: 4.1 g/dL (ref 3.5–5.2)
ALT: 47 U/L (ref 0–53)
AST: 33 U/L (ref 0–37)
Alkaline Phosphatase: 93 U/L (ref 39–117)
BILIRUBIN TOTAL: 0.5 mg/dL (ref 0.3–1.2)
BUN: 15 mg/dL (ref 6–23)
CHLORIDE: 101 meq/L (ref 96–112)
CO2: 23 mEq/L (ref 19–32)
CREATININE: 1 mg/dL (ref 0.50–1.35)
Calcium: 9.4 mg/dL (ref 8.4–10.5)
GFR calc Af Amer: >90 mL/min (ref >90)
GFR calc non Af Amer: 89 mL/min — ABNORMAL LOW (ref >90)
Glucose, Bld: 422 mg/dL — ABNORMAL HIGH (ref 70–99)
Potassium: 3.5 mEq/L — ABNORMAL LOW (ref 3.7–5.3)
SODIUM: 139 meq/L (ref 137–147)
Total Protein: 7.5 g/dL (ref 6.0–8.3)

## 2013-11-01 LAB — GLUCOSE, CAPILLARY
GLUCOSE-CAPILLARY: 364 mg/dL — AB (ref 70–99)
GLUCOSE-CAPILLARY: 405 mg/dL — AB (ref 70–99)
Glucose-Capillary: 294 mg/dL — ABNORMAL HIGH (ref 70–99)

## 2013-11-01 MED ORDER — SODIUM CHLORIDE 0.9 % IV BOLUS (SEPSIS)
1000.0000 mL | INTRAVENOUS | Status: AC
  Administered 2013-11-01: 1000 mL via INTRAVENOUS

## 2013-11-01 MED ORDER — DIPHENHYDRAMINE HCL 50 MG/ML IJ SOLN
12.5000 mg | Freq: Once | INTRAMUSCULAR | Status: AC
  Administered 2013-11-01: 12.5 mg via INTRAVENOUS
  Filled 2013-11-01: qty 1

## 2013-11-01 MED ORDER — OXYCODONE-ACETAMINOPHEN 5-325 MG PO TABS
1.0000 | ORAL_TABLET | Freq: Once | ORAL | Status: AC
  Administered 2013-11-01: 1 via ORAL
  Filled 2013-11-01: qty 1

## 2013-11-01 MED ORDER — METOCLOPRAMIDE HCL 5 MG/ML IJ SOLN
10.0000 mg | Freq: Once | INTRAMUSCULAR | Status: AC
  Administered 2013-11-01: 10 mg via INTRAVENOUS
  Filled 2013-11-01: qty 2

## 2013-11-01 MED ORDER — INSULIN ASPART 100 UNIT/ML ~~LOC~~ SOLN
3.0000 [IU] | Freq: Once | SUBCUTANEOUS | Status: AC
  Administered 2013-11-01: 3 [IU] via INTRAVENOUS
  Filled 2013-11-01: qty 1

## 2013-11-01 MED ORDER — KETOROLAC TROMETHAMINE 30 MG/ML IJ SOLN
30.0000 mg | Freq: Once | INTRAMUSCULAR | Status: AC
  Administered 2013-11-01: 30 mg via INTRAVENOUS
  Filled 2013-11-01: qty 1

## 2013-11-01 MED ORDER — METFORMIN HCL 500 MG PO TABS
500.0000 mg | ORAL_TABLET | Freq: Once | ORAL | Status: AC
  Administered 2013-11-01: 500 mg via ORAL
  Filled 2013-11-01: qty 1

## 2013-11-01 NOTE — ED Notes (Signed)
Bed: WA04 Expected date:  Expected time:  Means of arrival:  Comments: EMS-hypertension, HR

## 2013-11-01 NOTE — ED Provider Notes (Signed)
CSN: [id number]     Arrival date & time 11/01/13  1556 History   First MD Initiated Contact with Patient 11/01/13 1851     Chief Complaint  Patient presents with  . Neck Pain  . Hyperglycemia     (Consider location/radiation/quality/duration/timing/severity/associated sxs/prior Treatment) Patient is a 48 y.o. male presenting with neck pain and hyperglycemia. The history is provided by the patient.  Neck Pain Pain location:  Generalized neck (worse on left) Quality:  Aching Pain radiates to:  Does not radiate Pain severity:  Moderate Pain is:  Same all the time Onset quality:  Gradual Duration:  4 days Timing:  Constant Progression:  Unchanged Chronicity:  New Context comment:  Possibly water aerobics Relieved by:  Nothing Exacerbated by: rotating neck. Ineffective treatments:  NSAIDs Associated symptoms: photophobia   Associated symptoms: no chest pain, no fever, no headaches, no numbness, no paresis, no visual change and no weakness   Hyperglycemia Associated symptoms: no abdominal pain, no chest pain, no dysuria, no fever, no nausea, no shortness of breath and no vomiting     Past Medical History  Diagnosis Date  . Hypertension   . High cholesterol    Past Surgical History  Procedure Laterality Date  . Tonsillectomy    . Back surgery      in 2004, stimulator installed  . Arm surgery      distal left arm ulnar and radial break   Family History  Problem Relation Age of Onset  . Rheum arthritis Mother   . Diabetes Mother   . Hypertension Mother   . Cancer Father   . Diabetes Father   . Hypertension Father    History  Substance Use Topics  . Smoking status: Never Smoker   . Smokeless tobacco: Not on file  . Alcohol Use: No    Review of Systems  Constitutional: Negative for fever.  HENT: Negative for drooling and rhinorrhea.   Eyes: Positive for photophobia. Negative for pain.  Respiratory: Negative for cough and shortness of breath.   Cardiovascular:  Negative for chest pain and leg swelling.  Gastrointestinal: Negative for nausea, vomiting, abdominal pain and diarrhea.  Genitourinary: Negative for dysuria and hematuria.  Musculoskeletal: Positive for neck pain. Negative for gait problem.  Skin: Negative for color change.  Neurological: Negative for weakness, numbness and headaches.  Hematological: Negative for adenopathy.  Psychiatric/Behavioral: Negative for behavioral problems.  All other systems reviewed and are negative.      Allergies  Crestor and Lipitor  Home Medications   Current Outpatient Rx  Name  Route  Sig  Dispense  Refill  . aspirin 81 MG chewable tablet   Oral   Chew 81 mg by mouth daily.           . Cinnamon 500 MG TABS   Oral   Take 1 tablet by mouth 2 (two) times daily.           . fluticasone (FLONASE) 50 MCG/ACT nasal spray   Nasal   Place 2 sprays into the nose daily.         Marland Kitchen. glipiZIDE (GLUCOTROL) 10 MG tablet   Oral   Take 10 mg by mouth 2 (two) times daily before a meal.           . hydrochlorothiazide (HYDRODIURIL) 25 MG tablet   Oral   Take 25 mg by mouth daily.           . metFORMIN (GLUCOPHAGE) 500 MG tablet   Oral  Take 1 tablet (500 mg total) by mouth 2 (two) times daily with a meal.   60 tablet   0   . metoCLOPramide (REGLAN) 10 MG tablet   Oral   Take 1 tablet (10 mg total) by mouth every 6 (six) hours as needed for nausea.   15 tablet   0   . Multiple Vitamins-Minerals (MULTI FOR HIM 50+ PO)   Oral   Take 1 tablet by mouth daily.           Marland Kitchen omeprazole (PRILOSEC) 20 MG capsule   Oral   Take 20 mg by mouth 2 (two) times daily before a meal.         . oxyCODONE (OXY IR/ROXICODONE) 5 MG immediate release tablet   Oral   Take 5 mg by mouth 3 (three) times daily as needed for severe pain (pain).         . polyethylene glycol (MIRALAX / GLYCOLAX) packet   Oral   Take 17 g by mouth daily as needed for mild constipation.         . topiramate  (TOPAMAX) 100 MG tablet   Oral   Take 100 mg by mouth 2 (two) times daily.           Marland Kitchen lisinopril (PRINIVIL,ZESTRIL) 40 MG tablet   Oral   Take 20 mg by mouth daily.            BP 138/79  Pulse 87  Temp(Src) 98.8 F (37.1 C) (Oral)  Resp 16  SpO2 99% Physical Exam  Nursing note and vitals reviewed. Constitutional: He is oriented to person, place, and time. He appears well-developed and well-nourished.  HENT:  Head: Normocephalic and atraumatic.  Right Ear: External ear normal.  Left Ear: External ear normal.  Nose: Nose normal.  Mouth/Throat: Oropharynx is clear and moist. No oropharyngeal exudate.  Eyes: Conjunctivae and EOM are normal. Pupils are equal, round, and reactive to light.  Neck: Normal range of motion. Neck supple.  Mildly limited rom of neck to the right. No vertebral ttp.   Cardiovascular: Normal rate, regular rhythm, normal heart sounds and intact distal pulses.  Exam reveals no gallop and no friction rub.   No murmur heard. Pulmonary/Chest: Effort normal and breath sounds normal. No respiratory distress. He has no wheezes.  Abdominal: Soft. Bowel sounds are normal. He exhibits no distension. There is no tenderness. There is no rebound and no guarding.  Musculoskeletal: Normal range of motion. He exhibits no edema and no tenderness.  Mild to moderate tenderness to palpation of the left paracervical area and also the left trapezius. Very mild tenderness to palpation of the right paracervical area.  Neurological: He is alert and oriented to person, place, and time. He has normal strength. No sensory deficit. Coordination normal.  Skin: Skin is warm and dry.  Psychiatric: He has a normal mood and affect. His behavior is normal.    ED Course  Procedures (including critical care time) Labs Review Labs Reviewed  GLUCOSE, CAPILLARY - Abnormal; Notable for the following:    Glucose-Capillary 294 (*)    All other components within normal limits  COMPREHENSIVE  METABOLIC PANEL - Abnormal; Notable for the following:    Potassium 3.5 (*)    Glucose, Bld 422 (*)    GFR calc non Af Amer 89 (*)    All other components within normal limits  URINALYSIS, ROUTINE W REFLEX MICROSCOPIC - Abnormal; Notable for the following:    Glucose, UA 250 (*)  All other components within normal limits  GLUCOSE, CAPILLARY - Abnormal; Notable for the following:    Glucose-Capillary 364 (*)    All other components within normal limits  GLUCOSE, CAPILLARY - Abnormal; Notable for the following:    Glucose-Capillary 405 (*)    All other components within normal limits  GLUCOSE, CAPILLARY - Abnormal; Notable for the following:    Glucose-Capillary 311 (*)    All other components within normal limits  CBC   Imaging Review No results found.  EKG Interpretation   None       MDM   Final diagnoses:  Headache  Neck strain  Hyperglycemia    7:44 PM 48 y.o. male who presents with neck pain and headache which began 4 days ago. The patient states that he does water aerobics and believes that he strained his neck. He states that he woke up last Thursday with posterior neck pain worse in the left paraspinal and trapezius area. He also notes mild occipital headache which started at that time as well. He does have a history of headaches. He denies any fevers or vomiting. He has had some mild nausea. He is afebrile and vital signs are unremarkable here. He takes metformin for diabetes but notes his blood sugars have been slightly worse since getting steroids for his chronic back pain in Oct '14. I suspect a musculoskeletal cause of his pain. Referred pain may also be leading to his HA. Will treat symptomatically and give IV fluids for hyperglycemia.   HA and sx improved on exam. Pt feeling much better. I gave him 3U insulin to help bring his BS down. Will rec he cont metformin and f/u closely w/ his pcp for his elevated BS's.  I have discussed the diagnosis/risks/treatment  options with the patient and believe the pt to be eligible for discharge home to follow-up with pcp to discuss BS's. We also discussed returning to the ED immediately if new or worsening sx occur. We discussed the sx which are most concerning (e.g., worsening HA, fever, increasing BS's) that necessitate immediate return. Medications administered to the patient during their visit and any new prescriptions provided to the patient are listed below.  Medications given during this visit Medications  oxyCODONE-acetaminophen (PERCOCET/ROXICET) 5-325 MG per tablet 1 tablet (1 tablet Oral Given 11/01/13 1655)  sodium chloride 0.9 % bolus 1,000 mL (0 mLs Intravenous Stopped 11/01/13 2152)  ketorolac (TORADOL) 30 MG/ML injection 30 mg (30 mg Intravenous Given 11/01/13 2021)  metoCLOPramide (REGLAN) injection 10 mg (10 mg Intravenous Given 11/01/13 2021)  diphenhydrAMINE (BENADRYL) injection 12.5 mg (12.5 mg Intravenous Given 11/01/13 2021)  sodium chloride 0.9 % bolus 1,000 mL (0 mLs Intravenous Stopped 11/01/13 2348)  oxyCODONE-acetaminophen (PERCOCET/ROXICET) 5-325 MG per tablet 1 tablet (1 tablet Oral Given 11/01/13 2214)  metFORMIN (GLUCOPHAGE) tablet 500 mg (500 mg Oral Given 11/01/13 2214)  insulin aspart (novoLOG) injection 3 Units (3 Units Intravenous Given 11/01/13 2348)    Discharge Medication List as of 11/02/2013  1:03 AM    START taking these medications   Details  cyclobenzaprine (FLEXERIL) 10 MG tablet Take 1 tablet (10 mg total) by mouth 2 (two) times daily as needed for muscle spasms., Starting 11/02/2013, Until Discontinued, Print           Junius Argyle, MD 11/02/13 1346

## 2013-11-01 NOTE — Progress Notes (Signed)
   CARE MANAGEMENT ED NOTE 11/01/2013  Patient:  Rob BuntingMACK,Terin L   Account Number:  [id number]  Date Initiated:  11/01/2013  Documentation initiated by:  Radford PaxFERRERO,Tamryn Popko  Subjective/Objective Assessment:   Patient presents to Ed with posterior and left sided neck pain.     Subjective/Objective Assessment Detail:   Patient with pmhx of HTN and high cholesterol.     Action/Plan:   Action/Plan Detail:   Anticipated DC Date:       Status Recommendation to Physician:   Result of Recommendation:    Other ED Services  Consult Working Plan    DC Planning Services  Other  PCP issues    Choice offered to / List presented to:            Status of service:  Completed, signed off  ED Comments:   ED Comments Detail:  EDCM spoke to patient at bedside.  Patient[facility]cp [facility] [place] System updated.

## 2013-11-01 NOTE — ED Notes (Signed)
Initial Contact - pt resting on stretcher with wife at bs, pt c/o pain to L side/posterior aspect of neck onset after working out.  Pt reports "feels like a muscle ache" and has had some relief with previous medication.  Neuros grossly intact.  Pt denies other complaints.  Skin PWD.  MAEI.  Speaking full/clear sentences.  NAD.

## 2013-11-01 NOTE — ED Notes (Signed)
Pt states he is feeling better and ready  To go home.

## 2013-11-01 NOTE — ED Notes (Addendum)
Pt reports having posterior and left sided neck pain since Thursday after water aerobics, which he does for chronic back pain due to spinal stenosis and degenerative disk disease. Pt reports being bed ridden for six months prior to taking water aerobics, which has helped re-gain mobility. Pt reports having high blood sugars since having steroid treatment for his back. Pt reports not being diagnosed with diabetes, however he reports taking metformin and glipizide daily. CBG on arrival was 294, however reports having a blood sugar of 461 last night. Pt is A/O x4 and in NAD.

## 2013-11-02 LAB — GLUCOSE, CAPILLARY: GLUCOSE-CAPILLARY: 311 mg/dL — AB (ref 70–99)

## 2013-11-02 MED ORDER — CYCLOBENZAPRINE HCL 10 MG PO TABS: 10.0000 mg | ORAL_TABLET | Freq: Two times a day (BID) | ORAL | Status: AC | PRN

## 2013-11-03 ENCOUNTER — Emergency Department (HOSPITAL_COMMUNITY): Payer: Medicare Other

## 2013-11-03 ENCOUNTER — Inpatient Hospital Stay (HOSPITAL_COMMUNITY)
Admission: EM | Admit: 2013-11-03 | Discharge: 2013-11-08 | DRG: 872 | Disposition: A | Discharge status: Home / Self Care | Payer: Medicare Other | Attending: Internal Medicine | Admitting: Internal Medicine

## 2013-11-03 ENCOUNTER — Encounter (HOSPITAL_COMMUNITY): Payer: Self-pay | Admitting: Emergency Medicine

## 2013-11-03 DIAGNOSIS — Z79899 Other long term (current) drug therapy: Secondary | ICD-10-CM

## 2013-11-03 DIAGNOSIS — E876 Hypokalemia: Secondary | ICD-10-CM

## 2013-11-03 DIAGNOSIS — Z794 Long term (current) use of insulin: Secondary | ICD-10-CM

## 2013-11-03 DIAGNOSIS — E7800 Pure hypercholesterolemia, unspecified: Secondary | ICD-10-CM | POA: Diagnosis present

## 2013-11-03 DIAGNOSIS — S161XXA Strain of muscle, fascia and tendon at neck level, initial encounter: Secondary | ICD-10-CM

## 2013-11-03 DIAGNOSIS — S139XXA Sprain of joints and ligaments of unspecified parts of neck, initial encounter: Secondary | ICD-10-CM | POA: Diagnosis present

## 2013-11-03 DIAGNOSIS — R197 Diarrhea, unspecified: Secondary | ICD-10-CM

## 2013-11-03 DIAGNOSIS — R51 Headache: Secondary | ICD-10-CM

## 2013-11-03 DIAGNOSIS — D72819 Decreased white blood cell count, unspecified: Secondary | ICD-10-CM | POA: Diagnosis present

## 2013-11-03 DIAGNOSIS — K5289 Other specified noninfective gastroenteritis and colitis: Secondary | ICD-10-CM | POA: Diagnosis present

## 2013-11-03 DIAGNOSIS — A419 Sepsis, unspecified organism: Principal | ICD-10-CM

## 2013-11-03 DIAGNOSIS — Z7982 Long term (current) use of aspirin: Secondary | ICD-10-CM

## 2013-11-03 DIAGNOSIS — D649 Anemia, unspecified: Secondary | ICD-10-CM | POA: Diagnosis present

## 2013-11-03 DIAGNOSIS — IMO0001 Reserved for inherently not codable concepts without codable children: Secondary | ICD-10-CM | POA: Diagnosis present

## 2013-11-03 DIAGNOSIS — Z8249 Family history of ischemic heart disease and other diseases of the circulatory system: Secondary | ICD-10-CM

## 2013-11-03 DIAGNOSIS — A080 Rotaviral enteritis: Secondary | ICD-10-CM | POA: Diagnosis present

## 2013-11-03 DIAGNOSIS — R6510 Systemic inflammatory response syndrome (SIRS) of non-infectious origin without acute organ dysfunction: Secondary | ICD-10-CM

## 2013-11-03 DIAGNOSIS — Z791 Long term (current) use of non-steroidal anti-inflammatories (NSAID): Secondary | ICD-10-CM

## 2013-11-03 DIAGNOSIS — I10 Essential (primary) hypertension: Secondary | ICD-10-CM

## 2013-11-03 DIAGNOSIS — E785 Hyperlipidemia, unspecified: Secondary | ICD-10-CM

## 2013-11-03 DIAGNOSIS — K7689 Other specified diseases of liver: Secondary | ICD-10-CM | POA: Diagnosis present

## 2013-11-03 DIAGNOSIS — Z833 Family history of diabetes mellitus: Secondary | ICD-10-CM

## 2013-11-03 DIAGNOSIS — IMO0002 Reserved for concepts with insufficient information to code with codable children: Secondary | ICD-10-CM | POA: Diagnosis present

## 2013-11-03 DIAGNOSIS — R509 Fever, unspecified: Secondary | ICD-10-CM

## 2013-11-03 DIAGNOSIS — R112 Nausea with vomiting, unspecified: Secondary | ICD-10-CM

## 2013-11-03 DIAGNOSIS — Z6841 Body Mass Index (BMI) 40.0 and over, adult: Secondary | ICD-10-CM

## 2013-11-03 DIAGNOSIS — K529 Noninfective gastroenteritis and colitis, unspecified: Secondary | ICD-10-CM | POA: Diagnosis present

## 2013-11-03 DIAGNOSIS — E1165 Type 2 diabetes mellitus with hyperglycemia: Secondary | ICD-10-CM

## 2013-11-03 DIAGNOSIS — D696 Thrombocytopenia, unspecified: Secondary | ICD-10-CM | POA: Diagnosis present

## 2013-11-03 DIAGNOSIS — R519 Headache, unspecified: Secondary | ICD-10-CM

## 2013-11-03 DIAGNOSIS — R739 Hyperglycemia, unspecified: Secondary | ICD-10-CM

## 2013-11-03 DIAGNOSIS — X58XXXA Exposure to other specified factors, initial encounter: Secondary | ICD-10-CM | POA: Diagnosis present

## 2013-11-03 HISTORY — DX: Type 2 diabetes mellitus without complications: E11.9

## 2013-11-03 LAB — CBC WITH DIFFERENTIAL/PLATELET
Basophils Absolute: 0 10*3/uL (ref 0.0–0.1)
Basophils Relative: 0 % (ref 0–1)
EOS ABS: 0.1 10*3/uL (ref 0.0–0.7)
EOS PCT: 1 % (ref 0–5)
HCT: 46.4 % (ref 39.0–52.0)
Hemoglobin: 16.5 g/dL (ref 13.0–17.0)
LYMPHS ABS: 0.6 10*3/uL — AB (ref 0.7–4.0)
LYMPHS PCT: 7 % — AB (ref 12–46)
MCH: 29.2 pg (ref 26.0–34.0)
MCHC: 35.6 g/dL (ref 30.0–36.0)
MCV: 82.1 fL (ref 78.0–100.0)
MONOS PCT: 4 % (ref 3–12)
Monocytes Absolute: 0.3 10*3/uL (ref 0.1–1.0)
NEUTROS PCT: 89 % — AB (ref 43–77)
Neutro Abs: 7.8 10*3/uL — ABNORMAL HIGH (ref 1.7–7.7)
PLATELETS: 207 10*3/uL (ref 150–400)
RBC: 5.65 MIL/uL (ref 4.22–5.81)
RDW: 12.9 % (ref 11.5–15.5)
WBC: 8.8 10*3/uL (ref 4.0–10.5)

## 2013-11-03 LAB — COMPREHENSIVE METABOLIC PANEL
ALT: 47 U/L (ref 0–53)
AST: 27 U/L (ref 0–37)
Albumin: 4.1 g/dL (ref 3.5–5.2)
Alkaline Phosphatase: 92 U/L (ref 39–117)
BUN: 14 mg/dL (ref 6–23)
CO2: 24 meq/L (ref 19–32)
Calcium: 9 mg/dL (ref 8.4–10.5)
Chloride: 101 mEq/L (ref 96–112)
Creatinine, Ser: 1.06 mg/dL (ref 0.50–1.35)
GFR, EST NON AFRICAN AMERICAN: 82 mL/min — AB (ref >90)
GLUCOSE: 253 mg/dL — AB (ref 70–99)
Potassium: 3.6 mEq/L — ABNORMAL LOW (ref 3.7–5.3)
SODIUM: 139 meq/L (ref 137–147)
TOTAL PROTEIN: 7.6 g/dL (ref 6.0–8.3)
Total Bilirubin: 0.7 mg/dL (ref 0.3–1.2)

## 2013-11-03 LAB — URINALYSIS, ROUTINE W REFLEX MICROSCOPIC
Bilirubin Urine: NEGATIVE
Glucose, UA: 100 mg/dL — AB
HGB URINE DIPSTICK: NEGATIVE
Ketones, ur: NEGATIVE mg/dL
Leukocytes, UA: NEGATIVE
NITRITE: NEGATIVE
PROTEIN: NEGATIVE mg/dL
SPECIFIC GRAVITY, URINE: 1.027 (ref 1.005–1.030)
UROBILINOGEN UA: 0.2 mg/dL (ref 0.0–1.0)
pH: 5 (ref 5.0–8.0)

## 2013-11-03 LAB — GLUCOSE, CAPILLARY
Glucose-Capillary: 227 mg/dL — ABNORMAL HIGH (ref 70–99)
Glucose-Capillary: 240 mg/dL — ABNORMAL HIGH (ref 70–99)

## 2013-11-03 LAB — LIPASE, BLOOD: LIPASE: 27 U/L (ref 11–59)

## 2013-11-03 MED ORDER — IOHEXOL 300 MG/ML  SOLN
100.0000 mL | Freq: Once | INTRAMUSCULAR | Status: AC | PRN
  Administered 2013-11-03: 100 mL via INTRAVENOUS

## 2013-11-03 MED ORDER — SODIUM CHLORIDE 0.9 % IV BOLUS (SEPSIS)
1000.0000 mL | Freq: Once | INTRAVENOUS | Status: AC
  Administered 2013-11-03: 1000 mL via INTRAVENOUS

## 2013-11-03 MED ORDER — ONDANSETRON HCL 4 MG/2ML IJ SOLN
4.0000 mg | Freq: Once | INTRAMUSCULAR | Status: DC
  Filled 2013-11-03 (×2): qty 2

## 2013-11-03 MED ORDER — ONDANSETRON HCL 4 MG/2ML IJ SOLN
4.0000 mg | Freq: Once | INTRAMUSCULAR | Status: AC
  Administered 2013-11-03: 4 mg via INTRAVENOUS
  Filled 2013-11-03: qty 2

## 2013-11-03 MED ORDER — IOHEXOL 300 MG/ML  SOLN
50.0000 mL | Freq: Once | INTRAMUSCULAR | Status: AC | PRN
  Administered 2013-11-03: 50 mL via ORAL

## 2013-11-03 MED ORDER — HYDROMORPHONE HCL PF 1 MG/ML IJ SOLN
1.0000 mg | Freq: Once | INTRAMUSCULAR | Status: AC
  Administered 2013-11-03: 1 mg via INTRAVENOUS
  Filled 2013-11-03: qty 1

## 2013-11-03 NOTE — ED Provider Notes (Signed)
CSN: [id number]     Arrival date & time 11/03/13  1620 History   First MD Initiated Contact with Patient 11/03/13 1946     Chief Complaint  Patient presents with  . Abdominal Pain  . Emesis  . Diarrhea     (Consider location/radiation/quality/duration/timing/severity/associated sxs/prior Treatment) HPI  Patient with significant PMH for diabetes , hypertension presents to the ER with complaints of nausea, vomiting, diarrhea and abdominal pains. His wife who is here with him says " he is not acting right, he is real sick" and his CBGs have been in the 400's at home. He typically stays in the 100's on oral medications for diabetes. He started to feel badly this morning with uncontrollable vomiting and diarrhea. He says he feels nauseous and week and his belly is hurting. He has been able to keep down his hypoglycemic medications. Pt appears looks sick. VSS in triage.  Past Medical History  Diagnosis Date  . Hypertension   . High cholesterol   . Diabetes mellitus without complication    Past Surgical History  Procedure Laterality Date  . Tonsillectomy    . Back surgery      in 2004, stimulator installed  . Arm surgery      distal left arm ulnar and radial break   Family History  Problem Relation Age of Onset  . Rheum arthritis Mother   . Diabetes Mother   . Hypertension Mother   . Cancer Father   . Diabetes Father   . Hypertension Father    History  Substance Use Topics  . Smoking status: Never Smoker   . Smokeless tobacco: Not on file  . Alcohol Use: No    Review of Systems  The patient denies anorexia, fever, weight loss,, vision loss, decreased hearing, hoarseness, chest pain, syncope, dyspnea on exertion, peripheral edema, balance deficits, hemoptysis, melena, hematochezia, severe indigestion/heartburn, hematuria, incontinence, genital sores, muscle weakness, suspicious skin lesions, transient blindness, difficulty walking, depression, unusual weight change, abnormal  bleeding, enlarged lymph nodes, angioedema, and breast masses.    Allergies  Crestor and Lipitor  Home Medications   Current Outpatient Rx  Name  Route  Sig  Dispense  Refill  . aspirin 81 MG chewable tablet   Oral   Chew 81 mg by mouth daily.           . Cinnamon 500 MG TABS   Oral   Take 1 tablet by mouth 2 (two) times daily.           . fluticasone (FLONASE) 50 MCG/ACT nasal spray   Nasal   Place 2 sprays into the nose daily as needed for allergies.          Marland Kitchen glipiZIDE (GLUCOTROL) 10 MG tablet   Oral   Take 10 mg by mouth 2 (two) times daily before a meal.           . hydrochlorothiazide (HYDRODIURIL) 25 MG tablet   Oral   Take 25 mg by mouth daily.           Marland Kitchen ibuprofen (ADVIL,MOTRIN) 200 MG tablet   Oral   Take 400 mg by mouth every 6 (six) hours as needed for moderate pain.         Marland Kitchen lisinopril (PRINIVIL,ZESTRIL) 40 MG tablet   Oral   Take 20 mg by mouth daily.           . metFORMIN (GLUCOPHAGE) 500 MG tablet   Oral   Take 1 tablet (500 mg  total) by mouth 2 (two) times daily with a meal.   60 tablet   0   . metoCLOPramide (REGLAN) 10 MG tablet   Oral   Take 1 tablet (10 mg total) by mouth every 6 (six) hours as needed for nausea.   15 tablet   0   . Multiple Vitamins-Minerals (MULTI FOR HIM 50+ PO)   Oral   Take 1 tablet by mouth daily.           Marland Kitchen omeprazole (PRILOSEC) 20 MG capsule   Oral   Take 20 mg by mouth 2 (two) times daily before a meal.         . oxyCODONE (OXY IR/ROXICODONE) 5 MG immediate release tablet   Oral   Take 5 mg by mouth 3 (three) times daily as needed for severe pain (pain).         . polyethylene glycol (MIRALAX / GLYCOLAX) packet   Oral   Take 17 g by mouth daily as needed for mild constipation.         . topiramate (TOPAMAX) 100 MG tablet   Oral   Take 100 mg by mouth 2 (two) times daily.           . cyclobenzaprine (FLEXERIL) 10 MG tablet   Oral   Take 1 tablet (10 mg total) by mouth 2  (two) times daily as needed for muscle spasms.   15 tablet   0    BP 125/80  Pulse 104  Temp(Src) 99 F (37.2 C) (Oral)  Resp 20  Ht 5\' 11"  (1.803 m)  Wt 300 lb (136.079 kg)  BMI 41.86 kg/m2  SpO2 96% Physical Exam  Nursing note and vitals reviewed. Constitutional: He appears well-developed and well-nourished. He has a sickly appearance. No distress.  Mucous membranes are dry  HENT:  Head: Normocephalic and atraumatic.  Eyes: Pupils are equal, round, and reactive to light.  Neck: Normal range of motion. Neck supple.  Cardiovascular: Normal rate and regular rhythm.   Pulmonary/Chest: Effort normal.  Abdominal: Soft. There is tenderness in the right lower quadrant and periumbilical area. There is guarding. There is no rigidity, no rebound, no CVA tenderness, no tenderness at McBurney's point and negative Murphy's sign.  Exam difficult to assess due to large omentum.   Neurological: He is alert.  Skin: Skin is warm and dry.    ED Course  Procedures (including critical care time) Labs Review Labs Reviewed  GLUCOSE, CAPILLARY - Abnormal; Notable for the following:    Glucose-Capillary 227 (*)    All other components within normal limits  CBC WITH DIFFERENTIAL - Abnormal; Notable for the following:    Neutrophils Relative % 89 (*)    Neutro Abs 7.8 (*)    Lymphocytes Relative 7 (*)    Lymphs Abs 0.6 (*)    All other components within normal limits  COMPREHENSIVE METABOLIC PANEL - Abnormal; Notable for the following:    Potassium 3.6 (*)    Glucose, Bld 253 (*)    GFR calc non Af Amer 82 (*)    All other components within normal limits  URINALYSIS, ROUTINE W REFLEX MICROSCOPIC - Abnormal; Notable for the following:    Glucose, UA 100 (*)    All other components within normal limits  GLUCOSE, CAPILLARY - Abnormal; Notable for the following:    Glucose-Capillary 240 (*)    All other components within normal limits  LIPASE, BLOOD   Imaging Review Ct Abdomen Pelvis W  Contrast  11/03/2013   CLINICAL DATA:  Abdominal pain. Vomiting. Enteritis. Small bowel obstruction.  EXAM: CT ABDOMEN AND PELVIS WITH CONTRAST  TECHNIQUE: Multidetector CT imaging of the abdomen and pelvis was performed using the standard protocol following bolus administration of intravenous contrast.  CONTRAST:  50mL OMNIPAQUE IOHEXOL 300 MG/ML SOLN, OMNIPAQUE IOHEXOL 300 MG/ML SOLN  COMPARISON:  DG ABD 2 VIEWS dated 11/03/2013; CT ABD/PELVIS W CM dated 11/07/2012; CT ABD/PELV WO CM dated 09/10/2011  FINDINGS: Lung Bases: Dependent atelectasis. No airspace disease. Prominent extrapleural fat in the left lower lobe. Nodular atelectasis in the right posterior costophrenic angle.  Liver: Fatty liver. No mass lesions. Hepatomegaly accompanies the hepatosteatosis. Rounding of the inferior right hepatic lobe. Small area focal fatty sparing adjacent to the gallbladder fossa.  Spleen:  Old granulomatous disease.  Gallbladder:  Normal.  No calcified stones.  Common bile duct:  Normal.  Pancreas:  Normal.  Adrenal glands:  Normal adrenal glands.  Kidneys: Normal renal enhancement. Normal delayed excretion of contrast. Both ureters appear within normal limits.  Stomach:  Normal.  Small bowel: Duodenum appears normal. There is no small bowel obstruction. Mild distension and fluid fills distal small bowel.  Colon: Normal appendix. Fluid is present throughout the colon, abnormal but nonspecific. This is most commonly associated with enteric infection. There is no colonic mural thickening. A few scattered colonic diverticula are present.  Pelvic Genitourinary:   Normal urinary bladder.  No free fluid.  Bones: L5-S1 severe degenerative disc disease with grade I retrolisthesis. Spinal stimulator noted. No aggressive osseous lesions.  Vasculature: Minimal atherosclerosis. No acute vascular abnormality.  Body Wall: Fat containing periumbilical hernia. Prominent panniculus.  IMPRESSION: 1. Fluid levels within the colon are  abnormal but nonspecific, most commonly associated with enteric infection. No mural thickening. Mild diverticulosis. 2. Fatty liver with hepatomegaly.   Electronically Signed   By: Andreas Newport M.D.   On: 11/03/2013 22:12   Dg Abd 2 Views  11/03/2013   CLINICAL DATA:  DIARRHEA AND VOMITING TODAY WITH RIGHT LOWER QUADRANT PAIN  EXAM: ABDOMEN - 2 VIEW  COMPARISON:  DG ABD 2 VIEWS dated 09/03/2013  FINDINGS: SPINAL STIMULATOR NOTED. NONDILATED BOWEL WITH AIR-FLUID LEVELS THROUGHOUT SMALL AND LARGE BOWEL. MULTIPLE LOOPS OF FLUID-FILLED BOWEL IDENTIFIED.  IMPRESSION: FINDINGS MOST CONSISTENT WITH ILEUS OR ENTERITIS. GAS PATTERN DOES NOT SUGGEST OBSTRUCTION.   Electronically Signed   By: Esperanza Heir M.D.   On: 11/03/2013 20:22    EKG Interpretation   None       MDM   Final diagnoses:  Hyperglycemia  Enteritis  Nausea vomiting and diarrhea    CBG in triage is less than 250, pt is not in DKA.  Her CBC has a normal WBC. CMP- negative for any acute findings. CBG is 253 and potassium is 3.6. Lipase is negative   2 view abd shows findings consistent with ileus or enteritis. No sign of obstruction at this time.  Will order CT abd/pelv due to unable to exam belly because of body habitus. Saline IV and Dilaudid IV for pain medication ordered. Wife is concerned because patient looks ill and does not feel well.   11pm- the CT scan shows that he has some abnormal fluid levels but it is nonspecific, most consistent with enteric infection. The patient looks sick and has nto been able to control his sugars or keep his oral glycemic medication down. Will call hospitalist for admission. IV cipro and flagyl orders placed.   Inpatient, [facility], Team 8, med-surg  Dorthula Matasiffany G Batsheva Stevick, PA-C 11/04/13 [phone]

## 2013-11-03 NOTE — ED Notes (Signed)
Pt c/o RLQ abdominal pain, emesis, and diarrhea starting this afternoon.  Pain score 9/10.  Pt sts "my stomach hurts from throwing up."

## 2013-11-04 DIAGNOSIS — R7309 Other abnormal glucose: Secondary | ICD-10-CM

## 2013-11-04 DIAGNOSIS — A419 Sepsis, unspecified organism: Secondary | ICD-10-CM | POA: Diagnosis present

## 2013-11-04 DIAGNOSIS — R6510 Systemic inflammatory response syndrome (SIRS) of non-infectious origin without acute organ dysfunction: Secondary | ICD-10-CM

## 2013-11-04 DIAGNOSIS — I10 Essential (primary) hypertension: Secondary | ICD-10-CM | POA: Diagnosis present

## 2013-11-04 DIAGNOSIS — IMO0002 Reserved for concepts with insufficient information to code with codable children: Secondary | ICD-10-CM | POA: Diagnosis present

## 2013-11-04 DIAGNOSIS — K529 Noninfective gastroenteritis and colitis, unspecified: Secondary | ICD-10-CM | POA: Diagnosis present

## 2013-11-04 DIAGNOSIS — E1165 Type 2 diabetes mellitus with hyperglycemia: Secondary | ICD-10-CM | POA: Diagnosis present

## 2013-11-04 DIAGNOSIS — IMO0001 Reserved for inherently not codable concepts without codable children: Secondary | ICD-10-CM

## 2013-11-04 DIAGNOSIS — K5289 Other specified noninfective gastroenteritis and colitis: Secondary | ICD-10-CM

## 2013-11-04 DIAGNOSIS — E876 Hypokalemia: Secondary | ICD-10-CM | POA: Diagnosis present

## 2013-11-04 DIAGNOSIS — E785 Hyperlipidemia, unspecified: Secondary | ICD-10-CM | POA: Diagnosis present

## 2013-11-04 LAB — GLUCOSE, CAPILLARY
GLUCOSE-CAPILLARY: 314 mg/dL — AB (ref 70–99)
GLUCOSE-CAPILLARY: 217 mg/dL — AB (ref 70–99)
GLUCOSE-CAPILLARY: 211 mg/dL — AB (ref 70–99)
GLUCOSE-CAPILLARY: 258 mg/dL — AB (ref 70–99)
Glucose-Capillary: 203 mg/dL — ABNORMAL HIGH (ref 70–99)
Glucose-Capillary: 220 mg/dL — ABNORMAL HIGH (ref 70–99)

## 2013-11-04 LAB — LACTIC ACID, PLASMA: Lactic Acid, Venous: 1.9 mmol/L (ref 0.5–2.2)

## 2013-11-04 LAB — HEMOGLOBIN A1C
Hgb A1c MFr Bld: 11 % — ABNORMAL HIGH (ref <5.7)
MEAN PLASMA GLUCOSE: 269 mg/dL — AB (ref <117)

## 2013-11-04 LAB — CLOSTRIDIUM DIFFICILE BY PCR: Toxigenic C. Difficile by PCR: NEGATIVE

## 2013-11-04 MED ORDER — INSULIN ASPART 100 UNIT/ML ~~LOC~~ SOLN
0.0000 [IU] | Freq: Three times a day (TID) | SUBCUTANEOUS | Status: DC
  Administered 2013-11-04: 5 [IU] via SUBCUTANEOUS
  Administered 2013-11-05 – 2013-11-06 (×6): 3 [IU] via SUBCUTANEOUS
  Administered 2013-11-07: 5 [IU] via SUBCUTANEOUS
  Administered 2013-11-07: 8 [IU] via SUBCUTANEOUS
  Administered 2013-11-07: 3 [IU] via SUBCUTANEOUS
  Administered 2013-11-08 (×2): 5 [IU] via SUBCUTANEOUS

## 2013-11-04 MED ORDER — ENOXAPARIN SODIUM 60 MG/0.6ML ~~LOC~~ SOLN
60.0000 mg | Freq: Every day | SUBCUTANEOUS | Status: DC
  Administered 2013-11-04 – 2013-11-08 (×5): 60 mg via SUBCUTANEOUS
  Filled 2013-11-04 (×5): qty 0.6

## 2013-11-04 MED ORDER — FLUTICASONE PROPIONATE 50 MCG/ACT NA SUSP
2.0000 | Freq: Every day | NASAL | Status: DC | PRN
  Administered 2013-11-08: 2 via NASAL
  Filled 2013-11-04: qty 16

## 2013-11-04 MED ORDER — SODIUM CHLORIDE 0.9 % IV SOLN: INTRAVENOUS | Status: DC

## 2013-11-04 MED ORDER — LISINOPRIL 20 MG PO TABS
20.0000 mg | ORAL_TABLET | Freq: Every day | ORAL | Status: DC
  Filled 2013-11-04: qty 1

## 2013-11-04 MED ORDER — CIPROFLOXACIN IN D5W 400 MG/200ML IV SOLN
400.0000 mg | Freq: Once | INTRAVENOUS | Status: AC
  Administered 2013-11-04: 400 mg via INTRAVENOUS
  Filled 2013-11-04 (×2): qty 200

## 2013-11-04 MED ORDER — SODIUM CHLORIDE 0.9 % IV SOLN
INTRAVENOUS | Status: DC
  Administered 2013-11-04: 02:00:00 via INTRAVENOUS
  Filled 2013-11-04 (×5): qty 1000

## 2013-11-04 MED ORDER — INSULIN ASPART 100 UNIT/ML ~~LOC~~ SOLN
3.0000 [IU] | Freq: Three times a day (TID) | SUBCUTANEOUS | Status: DC
  Administered 2013-11-04 – 2013-11-05 (×4): 3 [IU] via SUBCUTANEOUS

## 2013-11-04 MED ORDER — ACETAMINOPHEN 650 MG RE SUPP: 650.0000 mg | Freq: Four times a day (QID) | RECTAL | Status: DC | PRN

## 2013-11-04 MED ORDER — ACETAMINOPHEN 325 MG PO TABS: 650.0000 mg | ORAL_TABLET | Freq: Four times a day (QID) | ORAL | Status: DC | PRN

## 2013-11-04 MED ORDER — SODIUM CHLORIDE 0.9 % IV SOLN
INTRAVENOUS | Status: DC
  Administered 2013-11-04: 12:00:00 via INTRAVENOUS

## 2013-11-04 MED ORDER — TOPIRAMATE 100 MG PO TABS
100.0000 mg | ORAL_TABLET | Freq: Two times a day (BID) | ORAL | Status: DC
  Administered 2013-11-04 – 2013-11-08 (×10): 100 mg via ORAL
  Filled 2013-11-04 (×11): qty 1

## 2013-11-04 MED ORDER — HYDROCHLOROTHIAZIDE 25 MG PO TABS
25.0000 mg | ORAL_TABLET | Freq: Every day | ORAL | Status: DC
  Filled 2013-11-04: qty 1

## 2013-11-04 MED ORDER — ONDANSETRON HCL 4 MG PO TABS
4.0000 mg | ORAL_TABLET | Freq: Four times a day (QID) | ORAL | Status: DC | PRN
  Administered 2013-11-05: 4 mg via ORAL
  Filled 2013-11-04: qty 1

## 2013-11-04 MED ORDER — HYDROCODONE-ACETAMINOPHEN 5-325 MG PO TABS
1.0000 | ORAL_TABLET | ORAL | Status: DC | PRN
  Administered 2013-11-04 – 2013-11-07 (×11): 2 via ORAL
  Filled 2013-11-04 (×11): qty 2

## 2013-11-04 MED ORDER — HYDROMORPHONE HCL PF 1 MG/ML IJ SOLN: 1.0000 mg | INTRAMUSCULAR | Status: DC | PRN

## 2013-11-04 MED ORDER — METHOCARBAMOL 100 MG/ML IJ SOLN
500.0000 mg | Freq: Four times a day (QID) | INTRAVENOUS | Status: DC | PRN
  Administered 2013-11-04: 500 mg via INTRAVENOUS
  Filled 2013-11-04 (×2): qty 5

## 2013-11-04 MED ORDER — PANTOPRAZOLE SODIUM 40 MG IV SOLR
40.0000 mg | Freq: Every day | INTRAVENOUS | Status: DC
  Administered 2013-11-04 – 2013-11-05 (×3): 40 mg via INTRAVENOUS
  Filled 2013-11-04 (×4): qty 40

## 2013-11-04 MED ORDER — ONDANSETRON HCL 4 MG/2ML IJ SOLN
4.0000 mg | Freq: Three times a day (TID) | INTRAMUSCULAR | Status: AC | PRN
Start: 2013-11-04 — End: 2013-11-04

## 2013-11-04 MED ORDER — METRONIDAZOLE IN NACL 5-0.79 MG/ML-% IV SOLN
500.0000 mg | Freq: Three times a day (TID) | INTRAVENOUS | Status: DC
  Administered 2013-11-04 – 2013-11-06 (×6): 500 mg via INTRAVENOUS
  Filled 2013-11-04 (×7): qty 100

## 2013-11-04 MED ORDER — INSULIN ASPART 100 UNIT/ML ~~LOC~~ SOLN
0.0000 [IU] | SUBCUTANEOUS | Status: DC
  Administered 2013-11-04: 5 [IU] via SUBCUTANEOUS
  Administered 2013-11-04: 11 [IU] via SUBCUTANEOUS
  Administered 2013-11-04: 5 [IU] via SUBCUTANEOUS

## 2013-11-04 MED ORDER — INSULIN DETEMIR 100 UNIT/ML ~~LOC~~ SOLN
10.0000 [IU] | Freq: Every day | SUBCUTANEOUS | Status: DC
  Administered 2013-11-04: 10 [IU] via SUBCUTANEOUS
  Filled 2013-11-04: qty 0.1

## 2013-11-04 MED ORDER — ENOXAPARIN SODIUM 40 MG/0.4ML ~~LOC~~ SOLN: 40.0000 mg | SUBCUTANEOUS | Status: DC

## 2013-11-04 MED ORDER — METRONIDAZOLE IN NACL 5-0.79 MG/ML-% IV SOLN
500.0000 mg | Freq: Once | INTRAVENOUS | Status: AC
  Administered 2013-11-04: 500 mg via INTRAVENOUS
  Filled 2013-11-04: qty 100

## 2013-11-04 MED ORDER — HYDROMORPHONE HCL PF 1 MG/ML IJ SOLN
1.0000 mg | INTRAMUSCULAR | Status: DC | PRN
  Administered 2013-11-05 – 2013-11-06 (×2): 1 mg via INTRAVENOUS
  Filled 2013-11-04 (×2): qty 1

## 2013-11-04 MED ORDER — HYDRALAZINE HCL 20 MG/ML IJ SOLN
10.0000 mg | INTRAMUSCULAR | Status: DC | PRN
Start: 2013-11-04 — End: 2013-11-08
  Filled 2013-11-04: qty 0.5

## 2013-11-04 MED ORDER — ASPIRIN 81 MG PO CHEW
81.0000 mg | CHEWABLE_TABLET | Freq: Every day | ORAL | Status: DC
  Administered 2013-11-04 – 2013-11-08 (×5): 81 mg via ORAL
  Filled 2013-11-04 (×5): qty 1

## 2013-11-04 MED ORDER — LOPERAMIDE HCL 2 MG PO CAPS
2.0000 mg | ORAL_CAPSULE | ORAL | Status: DC | PRN
  Administered 2013-11-04 – 2013-11-05 (×2): 2 mg via ORAL
  Filled 2013-11-04 (×2): qty 1

## 2013-11-04 MED ORDER — ONDANSETRON HCL 4 MG/2ML IJ SOLN
4.0000 mg | Freq: Four times a day (QID) | INTRAMUSCULAR | Status: DC | PRN
  Administered 2013-11-05: 4 mg via INTRAVENOUS

## 2013-11-04 MED ORDER — INSULIN ASPART 100 UNIT/ML ~~LOC~~ SOLN
0.0000 [IU] | Freq: Every day | SUBCUTANEOUS | Status: DC
  Administered 2013-11-04: 22:00:00 via SUBCUTANEOUS

## 2013-11-04 MED ORDER — INSULIN DETEMIR 100 UNIT/ML ~~LOC~~ SOLN
10.0000 [IU] | Freq: Two times a day (BID) | SUBCUTANEOUS | Status: DC
  Administered 2013-11-04 – 2013-11-05 (×2): 10 [IU] via SUBCUTANEOUS
  Filled 2013-11-04 (×4): qty 0.1

## 2013-11-04 MED ORDER — CIPROFLOXACIN IN D5W 400 MG/200ML IV SOLN
400.0000 mg | Freq: Two times a day (BID) | INTRAVENOUS | Status: DC
  Administered 2013-11-04 – 2013-11-05 (×4): 400 mg via INTRAVENOUS
  Filled 2013-11-04 (×5): qty 200

## 2013-11-04 MED ORDER — MORPHINE SULFATE 2 MG/ML IJ SOLN
2.0000 mg | INTRAMUSCULAR | Status: DC | PRN
  Administered 2013-11-04 (×2): 2 mg via INTRAVENOUS
  Filled 2013-11-04 (×2): qty 1

## 2013-11-04 NOTE — Progress Notes (Signed)
[id number] per home settings. Sterile water was added for humidification. Pt is using his home nasal mask and tubing. Pt looks comfortable and is tolerating CPAP well at this time. RT will continue to monitor as needed

## 2013-11-04 NOTE — Progress Notes (Signed)
Rx Brief Lovenox note  Pt with wt=136 kg, CrCl~122 ml/min, BMI>30 ordered Lovenox for DVT prophylaxis.  Rx adjusted Lovenox to 60mg  daily (~0.5 mg/kg) in obese pt.    Thanks,  Lorenza EvangelistGreen, Praneeth Bussey R 11/04/2013 1:41 AM

## 2013-11-04 NOTE — Progress Notes (Signed)
[facility] PROGRESS NOTE  Jerry BuntingRodney L Chavez VHQ:[id number] DOB: 08/08/1966 DOA: 11/03/2013 PCP: Provider Not In System  Assessment/Plan  SIRS (systemic inflammatory response syndrome) due to possible enteritis -  F/u blood cultures -  Continue cipro/flagyl -  F/u stool culture  Diarrhea with abdominal cramps.  No bowel wall thickening or abscess on CT -  Advance diet -  F/u stool culture, GI pathogen -  C. Diff negative -  Start imodium prn  NASH   Uncontrolled Diabetes mellitus A1c 11 with persistent hyperglycemia -  Add levemir 10units BID -  Add aspart 3 units AC -  Continue mod dose SSI with HS insulin  Hypertension BP low normal -  Hold HCTZ and lisinopril -  Start prn hydralazine for very elevated BPs  Morbid obesity  Counseled on weight loss   Neck pain due to muscle strain -  Continue prn flexeril and robaxin  Diet:  Diabetic diet Access:  PIV IVF:  yes Proph:  lovenox  Code Status: full Family Communication: patient and wife Disposition Plan: pending improvement in diarrhea, strength, energy, to home   Consultants:  None  Procedures:  CT abd/pelvis  Antibiotics:  cipro 2/12  Flagyl 2/12   HPI/Subjective:  Still has abdominal pain, particularly intermittent crampy pain RLQ, and headache and feels very unwell.  Persistent nausea and diarrhea  Objective: Filed Vitals:   11/04/13 0415 11/04/13 0700 11/04/13 1005 11/04/13 1440  BP:  103/65 103/64 110/65  Pulse:  73 75 77  Temp:  100.3 F (37.9 C) 99 F (37.2 C) 99.3 F (37.4 C)  TempSrc:  Oral  Oral  Resp: 18 16  15   Height:      Weight:      SpO2:  96%  98%    Intake/Output Summary (Last 24 hours) at 11/04/13 1507 Last data filed at 11/04/13 0900  Gross per 24 hour  Intake 685.83 ml  Output    500 ml  Net 185.83 ml   Filed Weights   11/03/13 1714  Weight: 136.079 kg (300 lb)    Exam:   General:  Obese BM, No acute distress  HEENT:  NCAT, MMM  Cardiovascular:  RRR,  nl S1, S2 no mrg, 2+ pulses, warm extremities  Respiratory:  CTAB, no increased WOB  Abdomen:   NABS, soft, mildly distended, TTP throughout without rebound or guarding  MSK:   Normal tone and bulk, no LEE  Neuro:  Grossly intact  Data Reviewed: Basic Metabolic Panel:  Recent Labs Lab 11/01/13 1835 11/03/13 1817  NA 139 139  K 3.5* 3.6*  CL 101 101  CO2 23 24  GLUCOSE 422* 253*  BUN 15 14  CREATININE 1.00 1.06  CALCIUM 9.4 9.0   Liver Function Tests:  Recent Labs Lab 11/01/13 1835 11/03/13 1817  AST 33 27  ALT 47 47  ALKPHOS 93 92  BILITOT 0.5 0.7  PROT 7.5 7.6  ALBUMIN 4.1 4.1    Recent Labs Lab 11/03/13 1817  LIPASE 27   No results found for this basename: AMMONIA,  in the last 168 hours CBC:  Recent Labs Lab 11/01/13 1835 11/03/13 1817  WBC 9.0 8.8  NEUTROABS  --  7.8*  HGB 16.1 16.5  HCT 45.2 46.4  MCV 82.6 82.1  PLT 232 207   Cardiac Enzymes: No results found for this basename: CKTOTAL, CKMB, CKMBINDEX, TROPONINI,  in the last 168 hours BNP (last 3 results) No results found for this basename: PROBNP,  in the last  8760 hours CBG:  Recent Labs Lab 11/03/13 2017 11/04/13 0014 11/04/13 0402 11/04/13 0711 11/04/13 1128  GLUCAP 240* 203* 314* 217* 220*    Recent Results (from the past 240 hour(s))  CLOSTRIDIUM DIFFICILE BY PCR     Status: None   Collection Time    11/04/13  2:00 AM      Result Value Ref Range Status   C difficile by pcr NEGATIVE  NEGATIVE Final   Comment: Performed at [facility]     Studies: Ct Abdomen Pelvis W Contrast  11/03/2013   CLINICAL DATA:  Abdominal pain. Vomiting. Enteritis. Small bowel obstruction.  EXAM: CT ABDOMEN AND PELVIS WITH CONTRAST  TECHNIQUE: Multidetector CT imaging of the abdomen and pelvis was performed using the standard protocol following bolus administration of intravenous contrast.  CONTRAST:  50mL OMNIPAQUE IOHEXOL 300 MG/ML SOLN, OMNIPAQUE IOHEXOL 300 MG/ML SOLN   COMPARISON:  DG ABD 2 VIEWS dated 11/03/2013; CT ABD/PELVIS W CM dated 11/07/2012; CT ABD/PELV WO CM dated 09/10/2011  FINDINGS: Lung Bases: Dependent atelectasis. No airspace disease. Prominent extrapleural fat in the left lower lobe. Nodular atelectasis in the right posterior costophrenic angle.  Liver: Fatty liver. No mass lesions. Hepatomegaly accompanies the hepatosteatosis. Rounding of the inferior right hepatic lobe. Small area focal fatty sparing adjacent to the gallbladder fossa.  Spleen:  Old granulomatous disease.  Gallbladder:  Normal.  No calcified stones.  Common bile duct:  Normal.  Pancreas:  Normal.  Adrenal glands:  Normal adrenal glands.  Kidneys: Normal renal enhancement. Normal delayed excretion of contrast. Both ureters appear within normal limits.  Stomach:  Normal.  Small bowel: Duodenum appears normal. There is no small bowel obstruction. Mild distension and fluid fills distal small bowel.  Colon: Normal appendix. Fluid is present throughout the colon, abnormal but nonspecific. This is most commonly associated with enteric infection. There is no colonic mural thickening. A few scattered colonic diverticula are present.  Pelvic Genitourinary:   Normal urinary bladder.  No free fluid.  Bones: L5-S1 severe degenerative disc disease with grade I retrolisthesis. Spinal stimulator noted. No aggressive osseous lesions.  Vasculature: Minimal atherosclerosis. No acute vascular abnormality.  Body Wall: Fat containing periumbilical hernia. Prominent panniculus.  IMPRESSION: 1. Fluid levels within the colon are abnormal but nonspecific, most commonly associated with enteric infection. No mural thickening. Mild diverticulosis. 2. Fatty liver with hepatomegaly.   Electronically Signed   By: Andreas Newport M.D.   On: 11/03/2013 22:12   Dg Abd 2 Views  11/03/2013   CLINICAL DATA:  DIARRHEA AND VOMITING TODAY WITH RIGHT LOWER QUADRANT PAIN  EXAM: ABDOMEN - 2 VIEW  COMPARISON:  DG ABD 2 VIEWS dated  09/03/2013  FINDINGS: SPINAL STIMULATOR NOTED. NONDILATED BOWEL WITH AIR-FLUID LEVELS THROUGHOUT SMALL AND LARGE BOWEL. MULTIPLE LOOPS OF FLUID-FILLED BOWEL IDENTIFIED.  IMPRESSION: FINDINGS MOST CONSISTENT WITH ILEUS OR ENTERITIS. GAS PATTERN DOES NOT SUGGEST OBSTRUCTION.   Electronically Signed   By: Esperanza Heir M.D.   On: 11/03/2013 20:22    Scheduled Meds: . aspirin  81 mg Oral Daily  . ciprofloxacin  400 mg Intravenous Q12H  . enoxaparin (LOVENOX) injection  60 mg Subcutaneous Daily  . insulin aspart  0-15 Units Subcutaneous TID WC  . insulin aspart  0-5 Units Subcutaneous QHS  . insulin detemir  10 Units Subcutaneous Daily  . lisinopril  20 mg Oral Daily  . metronidazole  500 mg Intravenous Q8H  . ondansetron  4 mg Intravenous Once  . pantoprazole (PROTONIX)  IV  40 mg Intravenous QHS  . topiramate  100 mg Oral BID   Continuous Infusions: . sodium chloride 75 mL/hr at 11/04/13 1228  . sodium chloride 0.9 % 1,000 mL with potassium chloride 40 mEq infusion 125 mL/hr at 11/04/13 0226    Principal Problem:   SIRS (systemic inflammatory response syndrome) Active Problems:   Hyperglycemia   Neck strain   Enteritis   Uncontrolled diabetes mellitus   Hypertension   Hypokalemia   Hyperlipemia   Morbid obesity    Time spent: 30 min    Donivan Thammavong, [facility]  [facility] Pager [phone]. If 7PM-7AM, please contact night-coverage at [email or web address], password [facility] 11/04/2013, 3:07 PM  LOS: 1 day

## 2013-11-04 NOTE — Progress Notes (Signed)
Utilization review completed.  

## 2013-11-04 NOTE — H&P (Addendum)
[facility] History and Physical  Jerry Chavez ZOX:[id number] DOB: 1966-06-08 DOA: 11/03/2013  Referring physician: ED PCP: [facility]  Chief Complaint:  abdominal pain with nausea, vomiting and diarrhea x 1 days  HPI:  48 year old morbidly obese male with past medical hx of diabetes mellitus 2 on oral hypoglycemic (started about 2 weeks back for persistent high blood glucose, A1c of 9 in December 2014),  hypertension, dyslipidemia, chronic low back pain presented with acute onset of nausea, vomiting and diarrhea with right lower quadrant abdominal pain since this morning. Patient was seen in the ED 2 days back for neck sprain and was prescribed some Flexeril and discharged home. During that time he was complaining of hypoglycemia with blood glucose ranging from 300s to 400s for  past few months. Patient reports his blood glucose to be 440 last night. Later this morning he started having acute onset of right lower quadrant abdominal pain associated with nausea and 3 episodes of vomiting of food particles along with several episodes of watery diarrhea not associated with mucus or blood. Patient denies headache, dizziness, fever, chills,  chest pain, palpitations, SOB, or urinary symptoms. Denies change in weight or appetite. He denied eating anything outside or being on antibiotics recently. Denies any recent travel. Denies similar symptoms in past.  Course in the ED Patient was febrile to 101.81F, tachycardic to 104, respiratory rate of 20 and blood pressure 138/84 mmHg. Blood work showed WBC of 8.8, hemoglobin of 16.5, HCT of 46.4, platelets of 207. Chemistry was normal except for potassium of 2.6. Anion gap was 14. Glucose was 253. Abdominal x-ray done shows multiple loops of fluid-filled bowel suggestive of ileus or enteritis. This was followed by CT scan of the abdomen and pelvis which showed fluid levels within the colon possibly associated with enteric infection. Patient was  given 2 L normal saline bolus, IV Zofran and 2 mg IV Dilaudid. Patient was given a dose of IV ciprofloxacin and Flagyl. [facility] consulted for admission to medical floor.   Review of Systems:  Constitutional: diaphoresis, Denies fever, chills, diaphoresis, appetite change and fatigue.  HEENT: neck pain and stiffness from recent sprain, Denies photophobia, eye pain, redness, hearing loss, ear pain, congestion, sore throat, rhinorrhea, sneezing, mouth sores, trouble swallowing,  and tinnitus.   Respiratory: Denies SOB, DOE, cough, chest tightness,  and wheezing.   Cardiovascular: Denies chest pain, palpitations and leg swelling.  Gastrointestinal: nausea, vomiting, abdominal pain, diarrhea, denies constipation, blood in stool and abdominal distention.  Genitourinary: Denies dysuria, urgency, frequency, hematuria, flank pain and difficulty urinating.  Endocrine: Denies hot or cold intolerance,  polyuria, polydipsia. Musculoskeletal: Back pain, neck pain Denies myalgias,  joint swelling, arthralgias and gait problem.  Neurological: Denies dizziness, seizures, syncope, weakness, light-headedness, numbness and headaches.  Psychiatric/Behavioral: Denies  confusion, nervousness, sleep disturbance and agitation   Past Medical History  Diagnosis Date  . Hypertension   . High cholesterol   . Diabetes mellitus without complication    Past Surgical History  Procedure Laterality Date  . Tonsillectomy    . Back surgery      in 2004, stimulator installed  . Arm surgery      distal left arm ulnar and radial break   Social History:  reports that he has never smoked. He does not have any smokeless tobacco history on file. He reports that he does not drink alcohol or use illicit drugs.  Allergies  Allergen Reactions  . Crestor [Rosuvastatin Calcium]   . Lipitor [  Atorvastatin Calcium] Other (See Comments)    Joint aches    Family History  Problem Relation Age of Onset  . Rheum  arthritis Mother   . Diabetes Mother   . Hypertension Mother   . Cancer Father   . Diabetes Father   . Hypertension Father     Prior to Admission medications   Medication Sig Start Date End Date Taking? Authorizing Provider  aspirin 81 MG chewable tablet Chew 81 mg by mouth daily.     Yes Historical Provider, MD  Cinnamon 500 MG TABS Take 1 tablet by mouth 2 (two) times daily.     Yes Historical Provider, MD  fluticasone (FLONASE) 50 MCG/ACT nasal spray Place 2 sprays into the nose daily as needed for allergies.    Yes Historical Provider, MD  glipiZIDE (GLUCOTROL) 10 MG tablet Take 10 mg by mouth 2 (two) times daily before a meal.     Yes Historical Provider, MD  hydrochlorothiazide (HYDRODIURIL) 25 MG tablet Take 25 mg by mouth daily.     Yes Historical Provider, MD  ibuprofen (ADVIL,MOTRIN) 200 MG tablet Take 400 mg by mouth every 6 (six) hours as needed for moderate pain.   Yes Historical Provider, MD  lisinopril (PRINIVIL,ZESTRIL) 40 MG tablet Take 20 mg by mouth daily.     Yes Historical Provider, MD  metFORMIN (GLUCOPHAGE) 500 MG tablet Take 1 tablet (500 mg total) by mouth 2 (two) times daily with a meal. 09/03/13  Yes Richardean Canal, MD  metoCLOPramide (REGLAN) 10 MG tablet Take 1 tablet (10 mg total) by mouth every 6 (six) hours as needed for nausea. 09/03/13  Yes Richardean Canal, MD  Multiple Vitamins-Minerals (MULTI FOR HIM 50+ PO) Take 1 tablet by mouth daily.     Yes Historical Provider, MD  omeprazole (PRILOSEC) 20 MG capsule Take 20 mg by mouth 2 (two) times daily before a meal.   Yes Historical Provider, MD  oxyCODONE (OXY IR/ROXICODONE) 5 MG immediate release tablet Take 5 mg by mouth 3 (three) times daily as needed for severe pain (pain).   Yes Historical Provider, MD  polyethylene glycol (MIRALAX / GLYCOLAX) packet Take 17 g by mouth daily as needed for mild constipation.   Yes Historical Provider, MD  topiramate (TOPAMAX) 100 MG tablet Take 100 mg by mouth 2 (two) times daily.      Yes Historical Provider, MD  cyclobenzaprine (FLEXERIL) 10 MG tablet Take 1 tablet (10 mg total) by mouth 2 (two) times daily as needed for muscle spasms. 11/02/13   Junius Argyle, MD     Physical Exam:  Filed Vitals:   11/03/13 1714 11/03/13 2043 11/03/13 2327  BP: 138/84 125/80 129/79  Pulse: 94 104 99  Temp: 98.9 F (37.2 C) 99 F (37.2 C) 101.3 F (38.5 C)  TempSrc: Oral Oral Oral  Resp: 20  19  Height: 5\' 11"  (1.803 m)    Weight: 136.079 kg (300 lb)    SpO2: 99% 96% 96%    Constitutional: Vital signs reviewed.  Patient is a middle aged obese male lying in bed in some distress with pain HEENT: no pallor, no icterus, dry oral mucosa,  Cardiovascular: RRR, S1 normal, S2 normal, no MRG Chest: CTAB, no wheezes, rales, or rhonchi Abdominal: Soft. Obese, non-distended, bowel sounds are normal, tender to palpation over right preriumbilical and right lower quadrant  GU: no CVA tenderness Ext: warm, no edema Neurological: A&O x3, non focal  Labs on Admission:  Basic Metabolic Panel:  Recent Labs Lab 11/01/13 1835 11/03/13 1817  NA 139 139  K 3.5* 3.6*  CL 101 101  CO2 23 24  GLUCOSE 422* 253*  BUN 15 14  CREATININE 1.00 1.06  CALCIUM 9.4 9.0   Liver Function Tests:  Recent Labs Lab 11/01/13 1835 11/03/13 1817  AST 33 27  ALT 47 47  ALKPHOS 93 92  BILITOT 0.5 0.7  PROT 7.5 7.6  ALBUMIN 4.1 4.1    Recent Labs Lab 11/03/13 1817  LIPASE 27   No results found for this basename: AMMONIA,  in the last 168 hours CBC:  Recent Labs Lab 11/01/13 1835 11/03/13 1817  WBC 9.0 8.8  NEUTROABS  --  7.8*  HGB 16.1 16.5  HCT 45.2 46.4  MCV 82.6 82.1  PLT 232 207   Cardiac Enzymes: No results found for this basename: CKTOTAL, CKMB, CKMBINDEX, TROPONINI,  in the last 168 hours BNP: No components found with this basename: POCBNP,  CBG:  Recent Labs Lab 11/01/13 2307 11/02/13 0034 11/03/13 1731 11/03/13 2017 11/04/13 0014  GLUCAP 405* 311* 227*  240* 203*    Radiological Exams on Admission: Ct Abdomen Pelvis W Contrast  11/03/2013   CLINICAL DATA:  Abdominal pain. Vomiting. Enteritis. Small bowel obstruction.  EXAM: CT ABDOMEN AND PELVIS WITH CONTRAST  TECHNIQUE: Multidetector CT imaging of the abdomen and pelvis was performed using the standard protocol following bolus administration of intravenous contrast.  CONTRAST:  50mL OMNIPAQUE IOHEXOL 300 MG/ML SOLN, OMNIPAQUE IOHEXOL 300 MG/ML SOLN  COMPARISON:  DG ABD 2 VIEWS dated 11/03/2013; CT ABD/PELVIS W CM dated 11/07/2012; CT ABD/PELV WO CM dated 09/10/2011  FINDINGS: Lung Bases: Dependent atelectasis. No airspace disease. Prominent extrapleural fat in the left lower lobe. Nodular atelectasis in the right posterior costophrenic angle.  Liver: Fatty liver. No mass lesions. Hepatomegaly accompanies the hepatosteatosis. Rounding of the inferior right hepatic lobe. Small area focal fatty sparing adjacent to the gallbladder fossa.  Spleen:  Old granulomatous disease.  Gallbladder:  Normal.  No calcified stones.  Common bile duct:  Normal.  Pancreas:  Normal.  Adrenal glands:  Normal adrenal glands.  Kidneys: Normal renal enhancement. Normal delayed excretion of contrast. Both ureters appear within normal limits.  Stomach:  Normal.  Small bowel: Duodenum appears normal. There is no small bowel obstruction. Mild distension and fluid fills distal small bowel.  Colon: Normal appendix. Fluid is present throughout the colon, abnormal but nonspecific. This is most commonly associated with enteric infection. There is no colonic mural thickening. A few scattered colonic diverticula are present.  Pelvic Genitourinary:   Normal urinary bladder.  No free fluid.  Bones: L5-S1 severe degenerative disc disease with grade I retrolisthesis. Spinal stimulator noted. No aggressive osseous lesions.  Vasculature: Minimal atherosclerosis. No acute vascular abnormality.  Body Wall: Fat containing periumbilical hernia.  Prominent panniculus.  IMPRESSION: 1. Fluid levels within the colon are abnormal but nonspecific, most commonly associated with enteric infection. No mural thickening. Mild diverticulosis. 2. Fatty liver with hepatomegaly.   Electronically Signed   By: Andreas Newport M.D.   On: 11/03/2013 22:12   Dg Abd 2 Views  11/03/2013   CLINICAL DATA:  DIARRHEA AND VOMITING TODAY WITH RIGHT LOWER QUADRANT PAIN  EXAM: ABDOMEN - 2 VIEW  COMPARISON:  DG ABD 2 VIEWS dated 09/03/2013  FINDINGS: SPINAL STIMULATOR NOTED. NONDILATED BOWEL WITH AIR-FLUID LEVELS THROUGHOUT SMALL AND LARGE BOWEL. MULTIPLE LOOPS OF FLUID-FILLED BOWEL IDENTIFIED.  IMPRESSION: FINDINGS MOST CONSISTENT WITH ILEUS OR ENTERITIS. GAS PATTERN  DOES NOT SUGGEST OBSTRUCTION.   Electronically Signed   By: Esperanza Heiraymond  Rubner M.D.   On: 11/03/2013 20:22      Assessment/Plan Principal Problem:   SIRS (systemic inflammatory response syndrome) Secondary to possible enteritis seen on CT scan of the abdomen and pelvis. Possibly infectious. Patient febrile on presentation. -Keep n.p.o. IV hydration with normal saline at 125 cc per hour. Send blood culture, GI pathogen, stool culture and stool for C. Difficile. -Supportive care with antiemetics and when necessary morphine for pain control. -Check lactic acid level. serial abdominal exam -Patient started on empiric ciprofloxacin and Flagyl in the ED which i will continue.   Active Problems:  Uncontrolled Diabetes mellitus Patient reports elevated blood glucose for past 4 months when he was given one time steroid injection for his back pain. fsg ranges from 200s to 400s at home.  Hold metformin and glipizide. Check A1C. monitor fsg. Started on SSI.fsg more stable now. Will need basal insulin once on a diet.  Hypertension Blood pressure stable. Resume HCTZ and lisinopril. Prn IV hydralazine if patient unable to take po meds  Morbid obesity Counseled on weight loss  Neck pain Secondary to recent  recent aquatic exercise for his LBP.was prescribed po flexeril during recent ED visit 2 days aback.  Added prn IV robaxin   Hypokalemia Replenish with IV fluids   Diet:NPO  DVT prophylaxis: sq lovenox   Code Status: full code Family Communication: discussed with patient and wife at bedside Disposition Plan: home once improved  Etienne Mowers [facility] Pager [phone]  Total time spent on admission :70 minutes  If 7PM-7AM, please contact night-coverage [email or web address] Password [email or web address] 11/04/2013, 1:00 AM

## 2013-11-04 NOTE — Progress Notes (Signed)
[facility] per home settings with no oxygen bled in. Sterile water was added for humidification. Pt is using his home nasal mask and tubing. Pt looks comfortable and is tolerating CPAP well at this time. RT will continue to monitor as needed.

## 2013-11-04 NOTE — Progress Notes (Signed)
Inpatient Diabetes Program Recommendations  AACE/ADA: New Consensus Statement on Inpatient Glycemic Control (2013)  Target Ranges:  Prepandial:   less than 140 mg/dL      Peak postprandial:   less than 180 mg/dL (1-2 hours)      Critically ill patients:  140 - 180 mg/dL   Hyperglycemia in 409'W300's and 400' Levemir 10 units ordered; however pt weight over 300 lbs (136 kg). If starting at lowest dose of 0.2 units/kg, pt would need minimum of  27 units levemir/day.; Pt is needing 10-11 units correction as well. Please consider increasing Levemir to at least 10 - 15 units bid. An increase in correction insulin to resistant may be helpful as well. Recommend not use Metformin now nor at discharge considering it's potential for extended nausea and bowel upset.    Thank you, Jerry CoffinAnn Amela Handley, RN, CNS, Diabetes Coordinator [phone])

## 2013-11-05 LAB — GLUCOSE, CAPILLARY
GLUCOSE-CAPILLARY: 179 mg/dL — AB (ref 70–99)
GLUCOSE-CAPILLARY: 188 mg/dL — AB (ref 70–99)
GLUCOSE-CAPILLARY: 170 mg/dL — AB (ref 70–99)
Glucose-Capillary: 187 mg/dL — ABNORMAL HIGH (ref 70–99)
Glucose-Capillary: 196 mg/dL — ABNORMAL HIGH (ref 70–99)
Glucose-Capillary: 207 mg/dL — ABNORMAL HIGH (ref 70–99)

## 2013-11-05 LAB — BASIC METABOLIC PANEL
BUN: 12 mg/dL (ref 6–23)
CALCIUM: 7.6 mg/dL — AB (ref 8.4–10.5)
CO2: 21 mEq/L (ref 19–32)
Chloride: 105 mEq/L (ref 96–112)
Creatinine, Ser: 1.11 mg/dL (ref 0.50–1.35)
GFR, EST NON AFRICAN AMERICAN: 78 mL/min — AB (ref >90)
GLUCOSE: 184 mg/dL — AB (ref 70–99)
POTASSIUM: 2.9 meq/L — AB (ref 3.7–5.3)
SODIUM: 140 meq/L (ref 137–147)

## 2013-11-05 LAB — CBC
HCT: 36.8 % — ABNORMAL LOW (ref 39.0–52.0)
HEMOGLOBIN: 12.9 g/dL — AB (ref 13.0–17.0)
MCH: 29.2 pg (ref 26.0–34.0)
MCHC: 35.1 g/dL (ref 30.0–36.0)
MCV: 83.3 fL (ref 78.0–100.0)
PLATELETS: 140 10*3/uL — AB (ref 150–400)
RBC: 4.42 MIL/uL (ref 4.22–5.81)
RDW: 13.1 % (ref 11.5–15.5)
WBC: 3.8 10*3/uL — ABNORMAL LOW (ref 4.0–10.5)

## 2013-11-05 MED ORDER — INSULIN DETEMIR 100 UNIT/ML ~~LOC~~ SOLN
15.0000 [IU] | Freq: Two times a day (BID) | SUBCUTANEOUS | Status: DC
  Administered 2013-11-05 – 2013-11-06 (×2): 15 [IU] via SUBCUTANEOUS
  Filled 2013-11-05 (×4): qty 0.15

## 2013-11-05 MED ORDER — POTASSIUM CHLORIDE 20 MEQ/15ML (10%) PO LIQD
40.0000 meq | Freq: Once | ORAL | Status: AC
  Administered 2013-11-05: 40 meq via ORAL
  Filled 2013-11-05: qty 30

## 2013-11-05 MED ORDER — INSULIN ASPART 100 UNIT/ML ~~LOC~~ SOLN
5.0000 [IU] | Freq: Three times a day (TID) | SUBCUTANEOUS | Status: DC
  Administered 2013-11-06 – 2013-11-07 (×5): 5 [IU] via SUBCUTANEOUS

## 2013-11-05 MED ORDER — POTASSIUM CHLORIDE CRYS ER 20 MEQ PO TBCR
40.0000 meq | EXTENDED_RELEASE_TABLET | Freq: Once | ORAL | Status: AC
  Administered 2013-11-05: 40 meq via ORAL
  Filled 2013-11-05: qty 2

## 2013-11-05 MED ORDER — SACCHAROMYCES BOULARDII 250 MG PO CAPS
250.0000 mg | ORAL_CAPSULE | Freq: Two times a day (BID) | ORAL | Status: DC
  Administered 2013-11-05 – 2013-11-08 (×6): 250 mg via ORAL
  Filled 2013-11-05 (×7): qty 1

## 2013-11-05 NOTE — Progress Notes (Signed)
Spoke with patient in regards to readiness for nighttime CPAP, patient states prefers to self administer when ready--verbalized understanding that RT available throughout night to assist as needed.

## 2013-11-05 NOTE — Progress Notes (Signed)
[facility] PROGRESS NOTE  Rob BuntingRodney L Barsamian NWG:[id number] DOB: 12/22/1965 DOA: 11/03/2013 PCP: Provider Not In System  Assessment/Plan  SIRS (systemic inflammatory response syndrome) due to possible enteritis, slowly improving -  blood cultures NGTD -  Continue cipro/flagyl -  F/u stool culture  Diarrhea with abdominal cramps.  No bowel wall thickening or abscess on CT -  Advance diet -  F/u stool culture, GI pathogen -  C. Diff negative -  Start imodium prn  NASH:  Recommend weight loss  Uncontrolled Diabetes mellitus A1c 11 with persistent hyperglycemia -  Increase to levemir 15 units BID -  Increase to aspart 5 units AC -  Continue mod dose SSI with HS insulin  Hypertension BP low normal -  Continue to hold HCTZ and lisinopril -  Start prn hydralazine for very elevated BPs  Morbid obesity  Counseled on weight loss   Neck pain due to muscle strain -  Continue prn flexeril and robaxin  Leukopenia, normocytic anemia, and thrombocytopenia, may be hemodilutional or lab error.  Less likely this represents DIC or malignancy  -  Repeat CBC in AM  Hypokalemia due to GI losses -  Check magnesium level -  Give oral potassium 80meq once  Diet:  Diabetic diet Access:  PIV IVF:  yes Proph:  lovenox  Code Status: full Family Communication: patient and wife Disposition Plan: pending improvement in diarrhea, strength, energy, to home   Consultants:  None  Procedures:  CT abd/pelvis  Antibiotics:  cipro 2/12  Flagyl 2/12   HPI/Subjective:  Feels much better today.  Still having diarrhea about hourly and persistent nausea, but energy improved and abdominal cramping improved  Objective: Filed Vitals:   11/04/13 2130 11/04/13 2236 11/05/13 0550 11/05/13 1417  BP: 112/67  100/64 114/73  Pulse: 78 74 65 73  Temp: 97.8 F (36.6 C)  98 F (36.7 C) 97.8 F (36.6 C)  TempSrc: Oral  Oral Oral  Resp: 16 16 16 16   Height:      Weight:      SpO2: 96% 98% 98%  97%    Intake/Output Summary (Last 24 hours) at 11/05/13 1739 Last data filed at 11/05/13 1500  Gross per 24 hour  Intake   1525 ml  Output      4 ml  Net   1521 ml   Filed Weights   11/03/13 1714  Weight: 136.079 kg (300 lb)    Exam:   General:  Obese BM, No acute distress  HEENT:  NCAT, MMM  Cardiovascular:  RRR, nl S1, S2 no mrg, 2+ pulses, warm extremities  Respiratory:  CTAB, no increased WOB  Abdomen:   NABS, soft, mildly distended, nontender  MSK:   Normal tone and bulk, no LEE  Neuro:  Grossly intact  Data Reviewed: Basic Metabolic Panel:  Recent Labs Lab 11/01/13 1835 11/03/13 1817 11/05/13 0444  NA 139 139 140  K 3.5* 3.6* 2.9*  CL 101 101 105  CO2 23 24 21   GLUCOSE 422* 253* 184*  BUN 15 14 12   CREATININE 1.00 1.06 1.11  CALCIUM 9.4 9.0 7.6*   Liver Function Tests:  Recent Labs Lab 11/01/13 1835 11/03/13 1817  AST 33 27  ALT 47 47  ALKPHOS 93 92  BILITOT 0.5 0.7  PROT 7.5 7.6  ALBUMIN 4.1 4.1    Recent Labs Lab 11/03/13 1817  LIPASE 27   No results found for this basename: AMMONIA,  in the last 168 hours CBC:  Recent Labs  Lab 11/01/13 1835 11/03/13 1817 11/05/13 0444  WBC 9.0 8.8 3.8*  NEUTROABS  --  7.8*  --   HGB 16.1 16.5 12.9*  HCT 45.2 46.4 36.8*  MCV 82.6 82.1 83.3  PLT 232 207 140*   Cardiac Enzymes: No results found for this basename: CKTOTAL, CKMB, CKMBINDEX, TROPONINI,  in the last 168 hours BNP (last 3 results) No results found for this basename: PROBNP,  in the last 8760 hours CBG:  Recent Labs Lab 11/05/13 0015 11/05/13 0352 11/05/13 0739 11/05/13 1112 11/05/13 1609  GLUCAP 207* 179* 187* 188* 196*    Recent Results (from the past 240 hour(s))  CULTURE, BLOOD (ROUTINE X 2)     Status: None   Collection Time    11/04/13  1:05 AM      Result Value Ref Range Status   Specimen Description BLOOD RIGHT ARM   Final   Special Requests BOTTLES DRAWN AEROBIC AND ANAEROBIC 3CC   Final   Culture   Setup Time     Final   Value: 11/04/2013 05:04     Performed at [facility]   Culture     Final   Value:        BLOOD CULTURE RECEIVED NO GROWTH TO DATE CULTURE WILL BE HELD FOR 5 DAYS BEFORE ISSUING A FINAL NEGATIVE REPORT     Performed at [facility]   Report Status PENDING   Incomplete  CULTURE, BLOOD (ROUTINE X 2)     Status: None   Collection Time    11/04/13  1:10 AM      Result Value Ref Range Status   Specimen Description BLOOD RIGHT WRIST   Final   Special Requests BOTTLES DRAWN AEROBIC AND ANAEROBIC 5CC   Final   Culture  Setup Time     Final   Value: 11/04/2013 05:05     Performed at [facility]   Culture     Final   Value:        BLOOD CULTURE RECEIVED NO GROWTH TO DATE CULTURE WILL BE HELD FOR 5 DAYS BEFORE ISSUING A FINAL NEGATIVE REPORT     Performed at [facility]   Report Status PENDING   Incomplete  CLOSTRIDIUM DIFFICILE BY PCR     Status: None   Collection Time    11/04/13  2:00 AM      Result Value Ref Range Status   C difficile by pcr NEGATIVE  NEGATIVE Final   Comment: Performed at [facility]  STOOL CULTURE     Status: None   Collection Time    11/04/13  2:00 AM      Result Value Ref Range Status   Specimen Description STOOL   Final   Special Requests NONE   Final   Culture     Final   Value: Culture reincubated for better growth     Performed at [facility]   Report Status PENDING   Incomplete     Studies: Ct Abdomen Pelvis W Contrast  11/03/2013   CLINICAL DATA:  Abdominal pain. Vomiting. Enteritis. Small bowel obstruction.  EXAM: CT ABDOMEN AND PELVIS WITH CONTRAST  TECHNIQUE: Multidetector CT imaging of the abdomen and pelvis was performed using the standard protocol following bolus administration of intravenous contrast.  CONTRAST:  50mL OMNIPAQUE IOHEXOL 300 MG/ML SOLN, OMNIPAQUE IOHEXOL 300 MG/ML SOLN  COMPARISON:  DG ABD 2 VIEWS dated 11/03/2013; CT ABD/PELVIS W CM dated 11/07/2012; CT  ABD/PELV WO  CM dated 09/10/2011  FINDINGS: Lung Bases: Dependent atelectasis. No airspace disease. Prominent extrapleural fat in the left lower lobe. Nodular atelectasis in the right posterior costophrenic angle.  Liver: Fatty liver. No mass lesions. Hepatomegaly accompanies the hepatosteatosis. Rounding of the inferior right hepatic lobe. Small area focal fatty sparing adjacent to the gallbladder fossa.  Spleen:  Old granulomatous disease.  Gallbladder:  Normal.  No calcified stones.  Common bile duct:  Normal.  Pancreas:  Normal.  Adrenal glands:  Normal adrenal glands.  Kidneys: Normal renal enhancement. Normal delayed excretion of contrast. Both ureters appear within normal limits.  Stomach:  Normal.  Small bowel: Duodenum appears normal. There is no small bowel obstruction. Mild distension and fluid fills distal small bowel.  Colon: Normal appendix. Fluid is present throughout the colon, abnormal but nonspecific. This is most commonly associated with enteric infection. There is no colonic mural thickening. A few scattered colonic diverticula are present.  Pelvic Genitourinary:   Normal urinary bladder.  No free fluid.  Bones: L5-S1 severe degenerative disc disease with grade I retrolisthesis. Spinal stimulator noted. No aggressive osseous lesions.  Vasculature: Minimal atherosclerosis. No acute vascular abnormality.  Body Wall: Fat containing periumbilical hernia. Prominent panniculus.  IMPRESSION: 1. Fluid levels within the colon are abnormal but nonspecific, most commonly associated with enteric infection. No mural thickening. Mild diverticulosis. 2. Fatty liver with hepatomegaly.   Electronically Signed   By: Andreas Newport M.D.   On: 11/03/2013 22:12   Dg Abd 2 Views  11/03/2013   CLINICAL DATA:  DIARRHEA AND VOMITING TODAY WITH RIGHT LOWER QUADRANT PAIN  EXAM: ABDOMEN - 2 VIEW  COMPARISON:  DG ABD 2 VIEWS dated 09/03/2013  FINDINGS: SPINAL STIMULATOR NOTED. NONDILATED BOWEL WITH AIR-FLUID LEVELS  THROUGHOUT SMALL AND LARGE BOWEL. MULTIPLE LOOPS OF FLUID-FILLED BOWEL IDENTIFIED.  IMPRESSION: FINDINGS MOST CONSISTENT WITH ILEUS OR ENTERITIS. GAS PATTERN DOES NOT SUGGEST OBSTRUCTION.   Electronically Signed   By: Esperanza Heir M.D.   On: 11/03/2013 20:22    Scheduled Meds: . aspirin  81 mg Oral Daily  . ciprofloxacin  400 mg Intravenous Q12H  . enoxaparin (LOVENOX) injection  60 mg Subcutaneous Daily  . insulin aspart  0-15 Units Subcutaneous TID WC  . insulin aspart  0-5 Units Subcutaneous QHS  . insulin aspart  3 Units Subcutaneous TID WC  . insulin detemir  10 Units Subcutaneous BID  . metronidazole  500 mg Intravenous Q8H  . ondansetron  4 mg Intravenous Once  . pantoprazole (PROTONIX) IV  40 mg Intravenous QHS  . saccharomyces boulardii  250 mg Oral BID  . topiramate  100 mg Oral BID   Continuous Infusions:    Principal Problem:   SIRS (systemic inflammatory response syndrome) Active Problems:   Hyperglycemia   Neck strain   Enteritis   Uncontrolled diabetes mellitus   Hypertension   Hypokalemia   Hyperlipemia   Morbid obesity    Time spent: 30 min    Preeya Cleckley, [facility]  [facility] Pager [phone]. If 7PM-7AM, please contact night-coverage at [email or web address], password [email or web address] 11/05/2013, 5:39 PM  LOS: 2 days

## 2013-11-05 NOTE — ED Provider Notes (Signed)
[name] is a chronically ill appearing man who presents with worsened abdominal pain with reports of gi bleeding.  He has a history of liver disease and continues to drink alcohol.  He reports fever at home and generalized weakness.  PE Chronically ill appearing male Abdomen soft with diffuse ttp, no current output in ostomy bag.   I performed a history and physical examination of Jerry Chavez and discussed his management with Ms. Green.  I agree with the history, physical, assessment, and plan of care, with the following exceptions: None  I was present for the following procedures: None Time Spent in Critical Care of the patient: None Time spent in discussions with the patient and family: [id number]  Jerry Chavez    Jerry Quarryanielle Chavez Tonna Palazzi, MD 11/05/13 1355

## 2013-11-06 DIAGNOSIS — R197 Diarrhea, unspecified: Secondary | ICD-10-CM

## 2013-11-06 DIAGNOSIS — R112 Nausea with vomiting, unspecified: Secondary | ICD-10-CM

## 2013-11-06 LAB — BASIC METABOLIC PANEL
BUN: 9 mg/dL (ref 6–23)
CO2: 21 mEq/L (ref 19–32)
CREATININE: 1.04 mg/dL (ref 0.50–1.35)
Calcium: 8 mg/dL — ABNORMAL LOW (ref 8.4–10.5)
Chloride: 107 mEq/L (ref 96–112)
GFR calc non Af Amer: 84 mL/min — ABNORMAL LOW (ref >90)
GLUCOSE: 162 mg/dL — AB (ref 70–99)
POTASSIUM: 2.9 meq/L — AB (ref 3.7–5.3)
Sodium: 141 mEq/L (ref 137–147)

## 2013-11-06 LAB — CBC
HCT: 36.4 % — ABNORMAL LOW (ref 39.0–52.0)
Hemoglobin: 12.9 g/dL — ABNORMAL LOW (ref 13.0–17.0)
MCH: 29.2 pg (ref 26.0–34.0)
MCHC: 35.4 g/dL (ref 30.0–36.0)
MCV: 82.4 fL (ref 78.0–100.0)
Platelets: 157 10*3/uL (ref 150–400)
RBC: 4.42 MIL/uL (ref 4.22–5.81)
RDW: 13.2 % (ref 11.5–15.5)
WBC: 3 10*3/uL — ABNORMAL LOW (ref 4.0–10.5)

## 2013-11-06 LAB — GLUCOSE, CAPILLARY
GLUCOSE-CAPILLARY: 185 mg/dL — AB (ref 70–99)
Glucose-Capillary: 163 mg/dL — ABNORMAL HIGH (ref 70–99)
Glucose-Capillary: 192 mg/dL — ABNORMAL HIGH (ref 70–99)
Glucose-Capillary: 151 mg/dL — ABNORMAL HIGH (ref 70–99)

## 2013-11-06 LAB — POTASSIUM: Potassium: 3.4 mEq/L — ABNORMAL LOW (ref 3.7–5.3)

## 2013-11-06 LAB — MAGNESIUM: Magnesium: 1.6 mg/dL (ref 1.5–2.5)

## 2013-11-06 MED ORDER — CIPROFLOXACIN HCL 500 MG PO TABS: 500.0000 mg | ORAL_TABLET | Freq: Two times a day (BID) | ORAL | Status: DC

## 2013-11-06 MED ORDER — METRONIDAZOLE 500 MG PO TABS
500.0000 mg | ORAL_TABLET | Freq: Three times a day (TID) | ORAL | Status: DC
  Administered 2013-11-06 – 2013-11-07 (×4): 500 mg via ORAL
  Filled 2013-11-06 (×5): qty 1

## 2013-11-06 MED ORDER — POTASSIUM CHLORIDE 10 MEQ/100ML IV SOLN
10.0000 meq | INTRAVENOUS | Status: DC
  Administered 2013-11-06: 10 meq via INTRAVENOUS
  Filled 2013-11-06 (×3): qty 100

## 2013-11-06 MED ORDER — INSULIN ASPART 100 UNIT/ML FLEXPEN: 8.0000 [IU] | PEN_INJECTOR | Freq: Three times a day (TID) | SUBCUTANEOUS | Status: DC

## 2013-11-06 MED ORDER — POTASSIUM CHLORIDE CRYS ER 20 MEQ PO TBCR
40.0000 meq | EXTENDED_RELEASE_TABLET | Freq: Once | ORAL | Status: AC
  Administered 2013-11-06: 40 meq via ORAL
  Filled 2013-11-06: qty 2

## 2013-11-06 MED ORDER — SACCHAROMYCES BOULARDII 250 MG PO CAPS: 250.0000 mg | ORAL_CAPSULE | Freq: Two times a day (BID) | ORAL | Status: AC

## 2013-11-06 MED ORDER — CIPROFLOXACIN HCL 500 MG PO TABS
500.0000 mg | ORAL_TABLET | Freq: Two times a day (BID) | ORAL | Status: DC
  Administered 2013-11-06 – 2013-11-07 (×3): 500 mg via ORAL
  Filled 2013-11-06 (×5): qty 1

## 2013-11-06 MED ORDER — MAGNESIUM SULFATE IN D5W 10-5 MG/ML-% IV SOLN
1.0000 g | Freq: Once | INTRAVENOUS | Status: DC
  Filled 2013-11-06: qty 100

## 2013-11-06 MED ORDER — INSULIN DETEMIR 100 UNIT/ML ~~LOC~~ SOLN
18.0000 [IU] | Freq: Two times a day (BID) | SUBCUTANEOUS | Status: DC
  Filled 2013-11-06: qty 0.18

## 2013-11-06 MED ORDER — POTASSIUM CHLORIDE CRYS ER 20 MEQ PO TBCR
40.0000 meq | EXTENDED_RELEASE_TABLET | Freq: Three times a day (TID) | ORAL | Status: DC
  Administered 2013-11-06: 40 meq via ORAL
  Filled 2013-11-06 (×3): qty 2

## 2013-11-06 MED ORDER — INSULIN DETEMIR 100 UNIT/ML ~~LOC~~ SOLN
20.0000 [IU] | Freq: Two times a day (BID) | SUBCUTANEOUS | Status: DC
  Administered 2013-11-06 – 2013-11-08 (×4): 20 [IU] via SUBCUTANEOUS
  Filled 2013-11-06 (×6): qty 0.2

## 2013-11-06 MED ORDER — POTASSIUM CHLORIDE CRYS ER 20 MEQ PO TBCR
40.0000 meq | EXTENDED_RELEASE_TABLET | ORAL | Status: AC
  Administered 2013-11-06 (×2): 40 meq via ORAL
  Filled 2013-11-06 (×2): qty 2

## 2013-11-06 MED ORDER — INSULIN GLARGINE 100 UNIT/ML SOLOSTAR PEN: 20.0000 [IU] | PEN_INJECTOR | Freq: Two times a day (BID) | SUBCUTANEOUS | Status: DC

## 2013-11-06 MED ORDER — PANTOPRAZOLE SODIUM 40 MG PO TBEC
40.0000 mg | DELAYED_RELEASE_TABLET | Freq: Every day | ORAL | Status: DC
  Administered 2013-11-06 – 2013-11-08 (×3): 40 mg via ORAL
  Filled 2013-11-06 (×3): qty 1

## 2013-11-06 MED ORDER — MAGNESIUM SULFATE 4000MG/100ML IJ SOLN
4.0000 g | Freq: Once | INTRAMUSCULAR | Status: AC
  Administered 2013-11-06: 4 g via INTRAVENOUS
  Filled 2013-11-06: qty 100

## 2013-11-06 NOTE — Progress Notes (Signed)
[facility] PROGRESS NOTE  Jerry BuntingRodney L Chavez NWG:[id number] DOB: 11/30/1965 DOA: 11/03/2013 PCP: Provider Not In System  Assessment/Plan  SIRS (systemic inflammatory response syndrome) due to possible enteritis, slowly improving -  blood cultures NGTD -  Continue cipro/flagyl -  F/u stool culture  Diarrhea with abdominal cramps.  No bowel wall thickening or abscess on CT.  improving -  F/u stool culture, GI pathogen -  C. Diff negative -  Continue imodium prn  NASH:  Recommend weight loss  Uncontrolled Diabetes mellitus A1c 11 with persistent hyperglycemia -  Increase to levemir 20 units BID -  Increase to aspart 5 units AC -  Continue mod dose SSI with HS insulin -  ** problem with insurance and wife was not able to fill rx for insulin this evening.  Pharmacy now closed and I cannot clarify what the problem was tonight.  Will call pharmacy and have case management investigate in the morning. **  Hypertension BP low normal -  Continue to hold HCTZ and lisinopril -  Start prn hydralazine for very elevated BPs  Morbid obesity  Counseled on weight loss   Neck pain due to muscle strain -  Continue prn flexeril and robaxin  Leukopenia, normocytic anemia, and thrombocytopenia, may be hemodilutional or lab error.  Less likely this represents DIC or malignancy  -  Repeat CBC in AM  Hypokalemia and hypomagnesemia due to GI losses -  IV magnesium sulfate -  Continue oral potassium repletion -  F/u 5pm potassium level and more supplementation if needed overnight -  Repeat BMP in AM  Diet:  Diabetic diet Access:  PIV IVF:  yes Proph:  lovenox  Code Status: full Family Communication: patient and wife Disposition Plan: pending improvement in diarrhea, strength, energy, to home   Consultants:  None  Procedures:  CT abd/pelvis  Antibiotics:  cipro 2/12  Flagyl 2/12   HPI/Subjective:  Feels much better today.  Stools less frequent and more formed.    Objective: Filed Vitals:   11/05/13 1417 11/05/13 2126 11/06/13 0556 11/06/13 0800  BP: 114/73 135/84 113/72 129/84  Pulse: 73 76 64 78  Temp: 97.8 F (36.6 C) 98.9 F (37.2 C) 98.7 F (37.1 C) 98.5 F (36.9 C)  TempSrc: Oral Oral Axillary Oral  Resp: 16 18 18 18   Height:      Weight:      SpO2: 97% 98%  98%    Intake/Output Summary (Last 24 hours) at 11/06/13 1804 Last data filed at 11/06/13 1330  Gross per 24 hour  Intake    580 ml  Output    300 ml  Net    280 ml   Filed Weights   11/03/13 1714  Weight: 136.079 kg (300 lb)    Exam:  General: Obese BM, No acute distress  HEENT: NCAT, MMM  Cardiovascular: RRR, nl S1, S2 no mrg, 2+ pulses, warm extremities  Respiratory: CTAB, no increased WOB  Abdomen: Hyperactive BS, soft, nondistended, nontender  MSK: Normal tone and bulk, no LEE  Neuro: Grossly intact   Data Reviewed: Basic Metabolic Panel:  Recent Labs Lab 11/01/13 1835 11/03/13 1817 11/05/13 0444 11/06/13 0553  NA 139 139 140 141  K 3.5* 3.6* 2.9* 2.9*  CL 101 101 105 107  CO2 23 24 21 21   GLUCOSE 422* 253* 184* 162*  BUN 15 14 12 9   CREATININE 1.00 1.06 1.11 1.04  CALCIUM 9.4 9.0 7.6* 8.0*  MG  --   --   --  1.6   Liver Function Tests:  Recent Labs Lab 11/01/13 1835 11/03/13 1817  AST 33 27  ALT 47 47  ALKPHOS 93 92  BILITOT 0.5 0.7  PROT 7.5 7.6  ALBUMIN 4.1 4.1    Recent Labs Lab 11/03/13 1817  LIPASE 27   No results found for this basename: AMMONIA,  in the last 168 hours CBC:  Recent Labs Lab 11/01/13 1835 11/03/13 1817 11/05/13 0444 11/06/13 0553  WBC 9.0 8.8 3.8* 3.0*  NEUTROABS  --  7.8*  --   --   HGB 16.1 16.5 12.9* 12.9*  HCT 45.2 46.4 36.8* 36.4*  MCV 82.6 82.1 83.3 82.4  PLT 232 207 140* 157   Cardiac Enzymes: No results found for this basename: CKTOTAL, CKMB, CKMBINDEX, TROPONINI,  in the last 168 hours BNP (last 3 results) No results found for this basename: PROBNP,  in the last 8760  hours CBG:  Recent Labs Lab 11/05/13 1609 11/05/13 2117 11/06/13 0727 11/06/13 1154 11/06/13 1716  GLUCAP 196* 170* 163* 192* 185*    Recent Results (from the past 240 hour(s))  CULTURE, BLOOD (ROUTINE X 2)     Status: None   Collection Time    11/04/13  1:05 AM      Result Value Ref Range Status   Specimen Description BLOOD RIGHT ARM   Final   Special Requests BOTTLES DRAWN AEROBIC AND ANAEROBIC 3CC   Final   Culture  Setup Time     Final   Value: 11/04/2013 05:04     Performed at [facility]   Culture     Final   Value:        BLOOD CULTURE RECEIVED NO GROWTH TO DATE CULTURE WILL BE HELD FOR 5 DAYS BEFORE ISSUING A FINAL NEGATIVE REPORT     Performed at [facility]   Report Status PENDING   Incomplete  CULTURE, BLOOD (ROUTINE X 2)     Status: None   Collection Time    11/04/13  1:10 AM      Result Value Ref Range Status   Specimen Description BLOOD RIGHT WRIST   Final   Special Requests BOTTLES DRAWN AEROBIC AND ANAEROBIC 5CC   Final   Culture  Setup Time     Final   Value: 11/04/2013 05:05     Performed at [facility]   Culture     Final   Value:        BLOOD CULTURE RECEIVED NO GROWTH TO DATE CULTURE WILL BE HELD FOR 5 DAYS BEFORE ISSUING A FINAL NEGATIVE REPORT     Performed at [facility]   Report Status PENDING   Incomplete  CLOSTRIDIUM DIFFICILE BY PCR     Status: None   Collection Time    11/04/13  2:00 AM      Result Value Ref Range Status   C difficile by pcr NEGATIVE  NEGATIVE Final   Comment: Performed at [facility]  STOOL CULTURE     Status: None   Collection Time    11/04/13  2:00 AM      Result Value Ref Range Status   Specimen Description STOOL   Final   Special Requests NONE   Final   Culture     Final   Value: NO SUSPICIOUS COLONIES, CONTINUING TO HOLD     Performed at [facility]   Report Status PENDING   Incomplete     Studies: No results found.  Scheduled Meds: . aspirin  81 mg  Oral Daily  . ciprofloxacin  500 mg Oral BID  . enoxaparin (LOVENOX) injection  60 mg Subcutaneous Daily  . insulin aspart  0-15 Units Subcutaneous TID WC  . insulin aspart  0-5 Units Subcutaneous QHS  . insulin aspart  5 Units Subcutaneous TID WC  . insulin detemir  20 Units Subcutaneous BID  . metroNIDAZOLE  500 mg Oral TID  . ondansetron  4 mg Intravenous Once  . pantoprazole (PROTONIX) IV  40 mg Intravenous QHS  . potassium chloride  40 mEq Oral Q4H  . saccharomyces boulardii  250 mg Oral BID  . topiramate  100 mg Oral BID   Continuous Infusions:    Principal Problem:   SIRS (systemic inflammatory response syndrome) Active Problems:   Hyperglycemia   Neck strain   Enteritis   Uncontrolled diabetes mellitus   Hypertension   Hypokalemia   Hyperlipemia   Morbid obesity    Time spent: 30 min    Edword Cu, [facility]  [facility] Pager [phone]. If 7PM-7AM, please contact night-coverage at [email or web address], password [facility] 11/06/2013, 6:04 PM  LOS: 3 days

## 2013-11-06 NOTE — Progress Notes (Addendum)
Offered to assist pt with placing cpap on for rest, but he refused.  Pt stated he prefers to self administer later when ready for bed.  Settings verified at 9cm h2o per home regimen.  Sterile water was added to humidity chamber.  Pt was advised that RT is available all night should he need anything further and encouraged him to call/let his nurse know.  RN notified.

## 2013-11-06 NOTE — Discharge Summary (Addendum)
Physician Discharge Summary  Jerry Chavez WRU:[id number] DOB: [birth year] DOA: 11/03/2013  PCP: PROVIDER NOT IN SYSTEM  Admit date: 11/03/2013 Discharge date: 02/09/2014  Recommendations for Outpatient Follow-up:  1. Follow up with primary care doctor in 1 week for repeat BMP and CBC to follow up potassium, leukopenia, and anemia.  Review blood sugars and adjust insulin as needed. 2. Referral to GI if symptoms recur.    Discharge Diagnoses:  Principal Problem:   Sepsis Active Problems:   Hyperglycemia   Neck strain   Enteritis   Uncontrolled diabetes mellitus   Hypertension   Hypokalemia   Hyperlipemia   Morbid obesity   Enteritis due to Rotavirus   Discharge Condition: stable, improved  Diet recommendation: diabetic  Wt Readings from Last 3 Encounters:  11/03/13 136.079 kg (300 lb)  11/07/12 129.275 kg (285 lb)  09/09/11 133.358 kg (294 lb)    History of present illness:  47 year old morbidly obese male with past medical hx of diabetes mellitus 2 on oral hypoglycemic (started about 2 weeks back for persistent high blood glucose, A1c of 9 in December 2014), hypertension, dyslipidemia, chronic low back pain presented with acute onset of nausea, vomiting and diarrhea with right lower quadrant abdominal pain since this morning. Patient was seen in the ED 2 days back for neck sprain and was prescribed some Flexeril and discharged home. During that time he was complaining of hypoglycemia with blood glucose ranging from 300s to 400s for past few months. Patient reports his blood glucose to be 440 last night. Later this morning he started having acute onset of right lower quadrant abdominal pain associated with nausea and 3 episodes of vomiting of food particles along with several episodes of watery diarrhea not associated with mucus or blood.  Patient denies headache, dizziness, fever, chills, chest pain, palpitations, SOB, or urinary symptoms. Denies change in weight or appetite. He  denied eating anything outside or being on antibiotics recently. Denies any recent travel. Denies similar symptoms in past.   Hospital Course:   Sepsis due to severe enteritis from rotavirus, resolving.  Jerry Chavez was started on ciprofloxacin and Flagyl for presumptive enteritis associated sepsis.  CT scan demonstrated no acute findings.  Diverticulosis was present without signs of diverticulitis.  His white blood cell count and his temperature trended down.  His stools became more firm and less frequent. C. difficile test was negative. GI pathogen panel was positive for rotavirus (wife works at daycare) and stool culture was negative. Blood cultures are no growth to date.  His antibiotics were discontinued once his cultures had been negative for > 48 hours and he was symptomatically improved.  If his enteritis recurs, recommend that he follow up with his primary care doctor for GI referral and further testing for non-infectious causes of diarrhea.    NASH, likely related to obesity. Defer his weight loss strategy to his primary care doctor appointment.  Consider bariatric surgery.    Uncontrolled diabetes mellitus with a hemoglobin A1c of 11 and persistent hyperglycemia.  His oral diabetes medications were held, particularly his metformin given the severity of his recent diarrhea.  He was started on NPH and regular insulin for now because he does not have supplemental insurance and the [place] pharmacy is closed due to weekend/holiday.  He was given information about diabetic diet.    Hypertension, blood pressures were initially mildly low so his HCTZ and his lisinopril were discontinued.  He was told to discontinue this medications this week, and his primary  care doctor may followup his blood pressures next week and restart these medications if indicated.  Neck pain due to muscle strain.  He continued Flexeril as needed.  Leukopenia, normocytic anemia, and thrombocytopenia, likely partially hemodilutional  and due to sepsis.  WBC nadired at 3, hemoglobin 12.9, plt 140 and they have resolved.  Repeat CBC at follow up appointment.  Hypokalemia and hypomagnesemia secondary to GI losses. Resolved with oral and IV repletion of potassium and magnesium..  Consultants:  None Procedures:  CT abd/pelvis Antibiotics:  cipro 2/12 > 2/15 Flagyl 2/12 > 2/15   Discharge Exam: Filed Vitals:   11/08/13 0520  BP: 113/73  Pulse: 64  Temp: 97.7 F (36.5 C)  Resp: 12   Filed Vitals:   11/07/13 0845 11/07/13 1405 11/07/13 2210 11/08/13 0520  BP: 125/84 122/83 119/77 113/73  Pulse: 62 74 60 64  Temp: 98.1 F (36.7 C) 98.2 F (36.8 C) 97 F (36.1 C) 97.7 F (36.5 C)  TempSrc: Oral Oral Oral Oral  Resp: 18 18 20 12   Height:      Weight:      SpO2: 100% 96% 100% 100%    General: Obese BM, No acute distress  HEENT: NCAT, MMM  Cardiovascular: RRR, nl S1, S2 no mrg, 2+ pulses, warm extremities  Respiratory: CTAB, no increased WOB  Abdomen:  NABS, soft, nondistended, nontender  MSK: Normal tone and bulk, no LEE  Neuro: Grossly intact   Discharge Instructions      Discharge Instructions   Call MD for:  difficulty breathing, headache or visual disturbances    Complete by:  As directed      Call MD for:  extreme fatigue    Complete by:  As directed      Call MD for:  hives    Complete by:  As directed      Call MD for:  persistant dizziness or light-headedness    Complete by:  As directed      Call MD for:  persistant nausea and vomiting    Complete by:  As directed      Call MD for:  severe uncontrolled pain    Complete by:  As directed      Call MD for:  temperature >100.4    Complete by:  As directed      Diet Carb Modified    Complete by:  As directed      Discharge instructions    Complete by:  As directed   You were hospitalized with sepsis due to severe diarrhea.  Please use imodium as needed for diarrhea.  I have given you a prescription for florastor which is sometimes  helpful in treatment of diarrhea, however, it is generally more expensive.  You may decide for yourself if you would like to continue this medication.  There are several tests which are still pending and your primary care doctor can follow up on these test results.  For your diabetes, please start using insulin.  You have been given prescriptions for long-acting insulin NPH and Enas Winchel acting insulin regular insulin.  Please check your blood sugar four times per day, before breakfast, lunch, dinner, and bed for the next week.  Record the numbers in a book with the time and date.  Bring these with you to your primary care doctor to review so that adjustments can be made to your doses.  Please stop your metformin and glipizide.  Because your blood pressures were low (due to your infection),  please do not take your blood pressure medications lisinopril and HCTZ.  As you recover from your infection, your blood pressure will probably go back up again.  Your primary care doctor can check your blood pressure at your next appointment and let you know when you should restart blood pressure medications.  If you have recurrent diarrhea, please get a referral to gastroenterology for additional testing.  If you have severe diarrhea, nausea, vomiting with dehydration, please return to the hospital.     Increase activity slowly    Complete by:  As directed             Medication List    STOP taking these medications       glipiZIDE 10 MG tablet  Commonly known as:  GLUCOTROL     hydrochlorothiazide 25 MG tablet  Commonly known as:  HYDRODIURIL     lisinopril 40 MG tablet  Commonly known as:  PRINIVIL,ZESTRIL     metFORMIN 500 MG tablet  Commonly known as:  GLUCOPHAGE      TAKE these medications       aspirin 81 MG chewable tablet  Chew 81 mg by mouth daily.     Cinnamon 500 MG Tabs  Take 1 tablet by mouth 2 (two) times daily.     cyclobenzaprine 10 MG tablet  Commonly known as:  FLEXERIL  Take 1  tablet (10 mg total) by mouth 2 (two) times daily as needed for muscle spasms.     fluticasone 50 MCG/ACT nasal spray  Commonly known as:  FLONASE  Place 2 sprays into the nose daily as needed for allergies.     ibuprofen 200 MG tablet  Commonly known as:  ADVIL,MOTRIN  Take 400 mg by mouth every 6 (six) hours as needed for moderate pain.     insulin NPH Human 100 UNIT/ML injection  Commonly known as:  HUMULIN N  Inject 20 Units into the skin 2 (two) times daily before a meal.     insulin regular 100 units/mL injection  Commonly known as:  HUMULIN R  Inject 0.1 mLs (10 Units total) into the skin 3 (three) times daily before meals.     metoCLOPramide 10 MG tablet  Commonly known as:  REGLAN  Take 1 tablet (10 mg total) by mouth every 6 (six) hours as needed for nausea.     MULTI FOR HIM 50+ PO  Take 1 tablet by mouth daily.     omeprazole 20 MG capsule  Commonly known as:  PRILOSEC  Take 20 mg by mouth 2 (two) times daily before a meal.     oxyCODONE 5 MG immediate release tablet  Commonly known as:  Oxy IR/ROXICODONE  Take 5 mg by mouth 3 (three) times daily as needed for severe pain (pain).     polyethylene glycol packet  Commonly known as:  MIRALAX / GLYCOLAX  Take 17 g by mouth daily as needed for mild constipation.     saccharomyces boulardii 250 MG capsule  Commonly known as:  FLORASTOR  Take 1 capsule (250 mg total) by mouth 2 (two) times daily.     topiramate 100 MG tablet  Commonly known as:  TOPAMAX  Take 100 mg by mouth 2 (two) times daily.       Follow-up Information   Follow up with Primary care doctor. Schedule an appointment as soon as possible for a visit in 1 week.       The results of significant diagnostics from this  hospitalization (including imaging, microbiology, ancillary and laboratory) are listed below for reference.    Significant Diagnostic Studies: Ct Abdomen Pelvis W Contrast  11/03/2013   CLINICAL DATA:  Abdominal pain. Vomiting.  Enteritis. Small bowel obstruction.  EXAM: CT ABDOMEN AND PELVIS WITH CONTRAST  TECHNIQUE: Multidetector CT imaging of the abdomen and pelvis was performed using the standard protocol following bolus administration of intravenous contrast.  CONTRAST:  50mL OMNIPAQUE IOHEXOL 300 MG/ML SOLN, OMNIPAQUE IOHEXOL 300 MG/ML SOLN  COMPARISON:  DG ABD 2 VIEWS dated 11/03/2013; CT ABD/PELVIS W CM dated 11/07/2012; CT ABD/PELV WO CM dated 09/10/2011  FINDINGS: Lung Bases: Dependent atelectasis. No airspace disease. Prominent extrapleural fat in the left lower lobe. Nodular atelectasis in the right posterior costophrenic angle.  Liver: Fatty liver. No mass lesions. Hepatomegaly accompanies the hepatosteatosis. Rounding of the inferior right hepatic lobe. Small area focal fatty sparing adjacent to the gallbladder fossa.  Spleen:  Old granulomatous disease.  Gallbladder:  Normal.  No calcified stones.  Common bile duct:  Normal.  Pancreas:  Normal.  Adrenal glands:  Normal adrenal glands.  Kidneys: Normal renal enhancement. Normal delayed excretion of contrast. Both ureters appear within normal limits.  Stomach:  Normal.  Small bowel: Duodenum appears normal. There is no small bowel obstruction. Mild distension and fluid fills distal small bowel.  Colon: Normal appendix. Fluid is present throughout the colon, abnormal but nonspecific. This is most commonly associated with enteric infection. There is no colonic mural thickening. A few scattered colonic diverticula are present.  Pelvic Genitourinary:   Normal urinary bladder.  No free fluid.  Bones: L5-S1 severe degenerative disc disease with grade I retrolisthesis. Spinal stimulator noted. No aggressive osseous lesions.  Vasculature: Minimal atherosclerosis. No acute vascular abnormality.  Body Wall: Fat containing periumbilical hernia. Prominent panniculus.  IMPRESSION: 1. Fluid levels within the colon are abnormal but nonspecific, most commonly associated with enteric  infection. No mural thickening. Mild diverticulosis. 2. Fatty liver with hepatomegaly.   Electronically Signed   By: Andreas Newport M.D.   On: 11/03/2013 22:12   Dg Abd 2 Views  11/03/2013   CLINICAL DATA:  DIARRHEA AND VOMITING TODAY WITH RIGHT LOWER QUADRANT PAIN  EXAM: ABDOMEN - 2 VIEW  COMPARISON:  DG ABD 2 VIEWS dated 09/03/2013  FINDINGS: SPINAL STIMULATOR NOTED. NONDILATED BOWEL WITH AIR-FLUID LEVELS THROUGHOUT SMALL AND LARGE BOWEL. MULTIPLE LOOPS OF FLUID-FILLED BOWEL IDENTIFIED.  IMPRESSION: FINDINGS MOST CONSISTENT WITH ILEUS OR ENTERITIS. GAS PATTERN DOES NOT SUGGEST OBSTRUCTION.   Electronically Signed   By: Esperanza Heir M.D.   On: 11/03/2013 20:22    Microbiology: No results found for this or any previous visit (from the past 240 hour(s)).   Labs: Basic Metabolic Panel: No results found for this basename: NA, K, CL, CO2, GLUCOSE, BUN, CREATININE, CALCIUM, MG, PHOS,  in the last 168 hours Liver Function Tests: No results found for this basename: AST, ALT, ALKPHOS, BILITOT, PROT, ALBUMIN,  in the last 168 hours No results found for this basename: LIPASE, AMYLASE,  in the last 168 hours No results found for this basename: AMMONIA,  in the last 168 hours CBC: No results found for this basename: WBC, NEUTROABS, HGB, HCT, MCV, PLT,  in the last 168 hours Cardiac Enzymes: No results found for this basename: CKTOTAL, CKMB, CKMBINDEX, TROPONINI,  in the last 168 hours BNP: BNP (last 3 results) No results found for this basename: PROBNP,  in the last 8760 hours CBG: No results found for this basename: GLUCAP,  in the last 168 hours  Time coordinating discharge: 45 minutes  Signed:  Renae Fickle  [facility] 02/09/2014, 6:02 PM

## 2013-11-06 NOTE — Progress Notes (Signed)
Notified Lenny Pastelom Callahan NP about potassium level of 2.9. Replacement has been ordered. Will continue to monitor.

## 2013-11-07 LAB — BASIC METABOLIC PANEL
BUN: 10 mg/dL (ref 6–23)
CO2: 21 mEq/L (ref 19–32)
CREATININE: 1.12 mg/dL (ref 0.50–1.35)
Calcium: 8.3 mg/dL — ABNORMAL LOW (ref 8.4–10.5)
Chloride: 110 mEq/L (ref 96–112)
GFR calc Af Amer: 89 mL/min — ABNORMAL LOW (ref >90)
GFR calc non Af Amer: 77 mL/min — ABNORMAL LOW (ref >90)
GLUCOSE: 170 mg/dL — AB (ref 70–99)
Potassium: 3.8 mEq/L (ref 3.7–5.3)
Sodium: 141 mEq/L (ref 137–147)

## 2013-11-07 LAB — GI PATHOGEN PANEL BY PCR, STOOL
C difficile toxin A/B: NEGATIVE
Campylobacter by PCR: NEGATIVE
Cryptosporidium by PCR: NEGATIVE
E coli (ETEC) LT/ST: NEGATIVE
E coli (STEC): NEGATIVE
E coli 0157 by PCR: NEGATIVE
G lamblia by PCR: NEGATIVE
Norovirus GI/GII: NEGATIVE
Rotavirus A by PCR: POSITIVE
Salmonella by PCR: NEGATIVE
Shigella by PCR: NEGATIVE

## 2013-11-07 LAB — GLUCOSE, CAPILLARY
GLUCOSE-CAPILLARY: 234 mg/dL — AB (ref 70–99)
Glucose-Capillary: 163 mg/dL — ABNORMAL HIGH (ref 70–99)
Glucose-Capillary: 274 mg/dL — ABNORMAL HIGH (ref 70–99)
Glucose-Capillary: 179 mg/dL — ABNORMAL HIGH (ref 70–99)

## 2013-11-07 LAB — CBC
HCT: 38.3 % — ABNORMAL LOW (ref 39.0–52.0)
HEMOGLOBIN: 13.1 g/dL (ref 13.0–17.0)
MCH: 28.6 pg (ref 26.0–34.0)
MCHC: 34.2 g/dL (ref 30.0–36.0)
MCV: 83.6 fL (ref 78.0–100.0)
Platelets: 156 10*3/uL (ref 150–400)
RBC: 4.58 MIL/uL (ref 4.22–5.81)
RDW: 13.5 % (ref 11.5–15.5)
WBC: 3.1 10*3/uL — ABNORMAL LOW (ref 4.0–10.5)

## 2013-11-07 MED ORDER — ALUM & MAG HYDROXIDE-SIMETH 200-200-20 MG/5ML PO SUSP: 30.0000 mL | ORAL | Status: DC | PRN

## 2013-11-07 MED ORDER — INSULIN ASPART 100 UNIT/ML ~~LOC~~ SOLN
7.0000 [IU] | Freq: Three times a day (TID) | SUBCUTANEOUS | Status: DC
  Administered 2013-11-07: 5 [IU] via SUBCUTANEOUS
  Administered 2013-11-08 (×2): 7 [IU] via SUBCUTANEOUS

## 2013-11-07 MED ORDER — IBUPROFEN 800 MG PO TABS
800.0000 mg | ORAL_TABLET | Freq: Four times a day (QID) | ORAL | Status: DC | PRN
  Administered 2013-11-07 – 2013-11-08 (×2): 800 mg via ORAL
  Filled 2013-11-07 (×2): qty 1

## 2013-11-07 NOTE — Progress Notes (Signed)
RT offered assistance with CPAP but Pt refused.  Pt prefers to self-administer when ready for bed.  RT to monitor and assess as needed.

## 2013-11-07 NOTE — Progress Notes (Signed)
[facility] PROGRESS NOTE  Jerry Chavez WJX:[id number] DOB: 04-Jul-1966 DOA: 11/03/2013 PCP: Provider Not In System  Assessment/Plan  SIRS (systemic inflammatory response syndrome) due to enteritis.  -  blood cultures NGTD -  F/u stool culture  Diarrhea with abdominal cramps.  No bowel wall thickening or abscess on CT.  Diarrhea and abdominal cramping worse again today -  stool culture no suspicious growth to date -  GI pathogen + rotavirus -  C. Diff negative -  Continue imodium prn -  D/c cipro/flagyl as this is likely not a bacterial infection -  Encouraged him to avoid milk products >> may have some transient lactose intolerance -  Maalox and ibuprofen as needed  NASH:  Recommend weight loss  Uncontrolled Diabetes mellitus A1c 11 with persistent hyperglycemia.  CBG elevated -  Continue levemir 20 units BID -  Increase to aspart 7 units AC -  Continue mod dose SSI with HS insulin -  May need to adjust insulin doses at discharge tomorrow  Hypertension BP improving -  Continue to hold HCTZ and lisinopril -  Start prn hydralazine for very elevated BPs  Morbid obesity  Counseled on weight loss   Neck pain due to muscle strain -  Continue prn flexeril and robaxin  Leukopenia, normocytic anemia, and thrombocytopenia, resolving.    Hypokalemia and hypomagnesemia due to GI losses, resolved with supplementation  Diet:  Diabetic diet Access:  PIV IVF:  yes Proph:  lovenox  Code Status: full Family Communication: patient and wife Disposition Plan: pending improvement in diarrhea, strength, energy, to home   Consultants:  None  Procedures:  CT abd/pelvis  Antibiotics:  cipro 2/12  Flagyl 2/12   HPI/Subjective:  More abdominal cramps today and having slightly more frequent stools.    Objective: Filed Vitals:   11/07/13 0545 11/07/13 0800 11/07/13 0845 11/07/13 1405  BP: 113/72 125/84 125/84 122/83  Pulse: 57 62 62 74  Temp: 97.5 F (36.4 C) 98.1  F (36.7 C) 98.1 F (36.7 C) 98.2 F (36.8 C)  TempSrc: Axillary Oral Oral Oral  Resp: 18 18 18 18   Height:      Weight:      SpO2: 100% 100% 100% 96%    Intake/Output Summary (Last 24 hours) at 11/07/13 1858 Last data filed at 11/07/13 1848  Gross per 24 hour  Intake    960 ml  Output      0 ml  Net    960 ml   Filed Weights   11/03/13 1714  Weight: 136.079 kg (300 lb)    Exam:  General: Obese BM, No acute distress  HEENT: NCAT, MMM  Cardiovascular: RRR, nl S1, S2 no mrg, 2+ pulses, warm extremities  Respiratory: CTAB, no increased WOB  Abdomen: Hyperactive BS, soft, nondistended, mild TTP diffusely without rebound or guarding  MSK: Normal tone and bulk, no LEE  Neuro: Grossly intact   Data Reviewed: Basic Metabolic Panel:  Recent Labs Lab 11/01/13 1835 11/03/13 1817 11/05/13 0444 11/06/13 0553 11/06/13 1718 11/07/13 0506  NA 139 139 140 141  --  141  K 3.5* 3.6* 2.9* 2.9* 3.4* 3.8  CL 101 101 105 107  --  110  CO2 23 24 21 21   --  21  GLUCOSE 422* 253* 184* 162*  --  170*  BUN 15 14 12 9   --  10  CREATININE 1.00 1.06 1.11 1.04  --  1.12  CALCIUM 9.4 9.0 7.6* 8.0*  --  8.3*  MG  --   --   --  1.6  --   --    Liver Function Tests:  Recent Labs Lab 11/01/13 1835 11/03/13 1817  AST 33 27  ALT 47 47  ALKPHOS 93 92  BILITOT 0.5 0.7  PROT 7.5 7.6  ALBUMIN 4.1 4.1    Recent Labs Lab 11/03/13 1817  LIPASE 27   No results found for this basename: AMMONIA,  in the last 168 hours CBC:  Recent Labs Lab 11/01/13 1835 11/03/13 1817 11/05/13 0444 11/06/13 0553 11/07/13 0506  WBC 9.0 8.8 3.8* 3.0* 3.1*  NEUTROABS  --  7.8*  --   --   --   HGB 16.1 16.5 12.9* 12.9* 13.1  HCT 45.2 46.4 36.8* 36.4* 38.3*  MCV 82.6 82.1 83.3 82.4 83.6  PLT 232 207 140* 157 156   Cardiac Enzymes: No results found for this basename: CKTOTAL, CKMB, CKMBINDEX, TROPONINI,  in the last 168 hours BNP (last 3 results) No results found for this basename: PROBNP,  in  the last 8760 hours CBG:  Recent Labs Lab 11/06/13 1716 11/06/13 2156 11/07/13 0752 11/07/13 1157 11/07/13 1642  GLUCAP 185* 151* 163* 274* 234*    Recent Results (from the past 240 hour(s))  CULTURE, BLOOD (ROUTINE X 2)     Status: None   Collection Time    11/04/13  1:05 AM      Result Value Ref Range Status   Specimen Description BLOOD RIGHT ARM   Final   Special Requests BOTTLES DRAWN AEROBIC AND ANAEROBIC 3CC   Final   Culture  Setup Time     Final   Value: 11/04/2013 05:04     Performed at [facility]   Culture     Final   Value:        BLOOD CULTURE RECEIVED NO GROWTH TO DATE CULTURE WILL BE HELD FOR 5 DAYS BEFORE ISSUING A FINAL NEGATIVE REPORT     Performed at [facility]   Report Status PENDING   Incomplete  CULTURE, BLOOD (ROUTINE X 2)     Status: None   Collection Time    11/04/13  1:10 AM      Result Value Ref Range Status   Specimen Description BLOOD RIGHT WRIST   Final   Special Requests BOTTLES DRAWN AEROBIC AND ANAEROBIC 5CC   Final   Culture  Setup Time     Final   Value: 11/04/2013 05:05     Performed at [facility]   Culture     Final   Value:        BLOOD CULTURE RECEIVED NO GROWTH TO DATE CULTURE WILL BE HELD FOR 5 DAYS BEFORE ISSUING A FINAL NEGATIVE REPORT     Performed at [facility]   Report Status PENDING   Incomplete  CLOSTRIDIUM DIFFICILE BY PCR     Status: None   Collection Time    11/04/13  2:00 AM      Result Value Ref Range Status   C difficile by pcr NEGATIVE  NEGATIVE Final   Comment: Performed at [facility]  STOOL CULTURE     Status: None   Collection Time    11/04/13  2:00 AM      Result Value Ref Range Status   Specimen Description STOOL   Final   Special Requests NONE   Final   Culture     Final   Value: NO SUSPICIOUS COLONIES, CONTINUING TO HOLD     Performed at [facility]  Report Status PENDING   Incomplete     Studies: No results found.  Scheduled Meds: .  aspirin  81 mg Oral Daily  . enoxaparin (LOVENOX) injection  60 mg Subcutaneous Daily  . insulin aspart  0-15 Units Subcutaneous TID WC  . insulin aspart  0-5 Units Subcutaneous QHS  . insulin aspart  7 Units Subcutaneous TID WC  . insulin detemir  20 Units Subcutaneous BID  . ondansetron  4 mg Intravenous Once  . pantoprazole  40 mg Oral Daily  . saccharomyces boulardii  250 mg Oral BID  . topiramate  100 mg Oral BID   Continuous Infusions:    Principal Problem:   SIRS (systemic inflammatory response syndrome) Active Problems:   Hyperglycemia   Neck strain   Enteritis   Uncontrolled diabetes mellitus   Hypertension   Hypokalemia   Hyperlipemia   Morbid obesity    Time spent: 30 min    Jerry Chavez, [facility]ENZIE  [facility] Pager [phone]. If 7PM-7AM, please contact night-coverage at [email or web address], password [email or web address] 11/07/2013, 6:58 PM  LOS: 4 days

## 2013-11-08 DIAGNOSIS — A419 Sepsis, unspecified organism: Secondary | ICD-10-CM

## 2013-11-08 DIAGNOSIS — E876 Hypokalemia: Secondary | ICD-10-CM

## 2013-11-08 LAB — STOOL CULTURE

## 2013-11-08 LAB — BASIC METABOLIC PANEL
BUN: 11 mg/dL (ref 6–23)
CO2: 19 mEq/L (ref 19–32)
Calcium: 8.1 mg/dL — ABNORMAL LOW (ref 8.4–10.5)
Chloride: 109 mEq/L (ref 96–112)
Creatinine, Ser: 0.97 mg/dL (ref 0.50–1.35)
GFR calc Af Amer: >90 mL/min (ref >90)
GFR calc non Af Amer: >90 mL/min (ref >90)
Glucose, Bld: 308 mg/dL — ABNORMAL HIGH (ref 70–99)
Potassium: 3.9 mEq/L (ref 3.7–5.3)
Sodium: 140 mEq/L (ref 137–147)

## 2013-11-08 LAB — CBC
HCT: 37.5 % — ABNORMAL LOW (ref 39.0–52.0)
Hemoglobin: 13.1 g/dL (ref 13.0–17.0)
MCH: 29 pg (ref 26.0–34.0)
MCHC: 34.9 g/dL (ref 30.0–36.0)
MCV: 83 fL (ref 78.0–100.0)
Platelets: 167 10*3/uL (ref 150–400)
RBC: 4.52 MIL/uL (ref 4.22–5.81)
RDW: 13.4 % (ref 11.5–15.5)
WBC: 3.8 10*3/uL — ABNORMAL LOW (ref 4.0–10.5)

## 2013-11-08 LAB — GLUCOSE, CAPILLARY
GLUCOSE-CAPILLARY: 225 mg/dL — AB (ref 70–99)
Glucose-Capillary: 232 mg/dL — ABNORMAL HIGH (ref 70–99)

## 2013-11-08 MED ORDER — INSULIN NPH (HUMAN) (ISOPHANE) 100 UNIT/ML ~~LOC~~ SUSP: 20.0000 [IU] | Freq: Two times a day (BID) | SUBCUTANEOUS | Status: AC

## 2013-11-08 MED ORDER — INSULIN REGULAR HUMAN 100 UNIT/ML IJ SOLN: 10.0000 [IU] | Freq: Three times a day (TID) | INTRAMUSCULAR | Status: AC

## 2013-11-08 NOTE — Care Management Note (Signed)
    Page 1 of 1   11/08/2013     4:50:12 PM   CARE MANAGEMENT NOTE 11/08/2013  Patient:  Jerry Chavez,Jerry Chavez   Account Number:  [id number]  Date Initiated:  11/06/2013  Documentation initiated by:  Sarah Bush [facility] Center[facility]AVIS,ALESIA  Subjective/Objective Assessment:   SIRS, DM, HTN     Action/Plan:   waiting final recommendations.   Anticipated DC Date:  11/08/2013   Anticipated DC Plan:  HOME/SELF CARE      DC Planning Services  CM consult      Choice offered to / List presented to:             Status of service:  Completed, signed off Medicare Important Message given?   (If response is "NO", the following Medicare IM given date fields will be blank) Date Medicare IM given:   Date Additional Medicare IM given:    Discharge Disposition:  HOME/SELF CARE  Per UR Regulation:    If discussed at Long Length of Stay Meetings, dates discussed:    Comments:  11/08/2013 Colleen CanLinda Antanasia Kaczynski BSN RN CCM [phone] CM spoke with patient & spouse. Pt states was advised by private pharmacist that he did not have part D coverage when wife tried to get prescriptions filled for new meds-Levimir. Spouse states cost of med $400.00. States spouse had prescription coverage prior to trying to get prescription filled this weekend. Pt states he had not re-applied for Part D coverage. Patient does have VA benefits but has not used it for drug coverage but [facility]. Mean while for immediate medical needs he plans to make [facility] to follow up after hospital stay. Pt is not eligible for Match program (Animatorassist director was consulted). CM requested attending to write prescription for more affordable insulin if possible. MD was able to write precription for more afforable meds. Pt states will be able to purchase. Pt was also given  Prescription drug discount card to use is applicable per pharmacy. Pt voices understanding of the above.

## 2013-11-08 NOTE — Progress Notes (Signed)
Patient received discharge instructions and verbalized understanding. Patient follow up appointments and medications discussed. Patient prescriptions given. Patient to be taken downstairs via wheelchair to be discharged home. Patient demonstrated injection of insulin successfully. Patient watched diabetes education videos and given care notes prior to discharge. Patient without further questions.

## 2013-11-10 LAB — CULTURE, BLOOD (ROUTINE X 2)
CULTURE: NO GROWTH
Culture: NO GROWTH

## 2014-02-09 DIAGNOSIS — A080 Rotaviral enteritis: Secondary | ICD-10-CM

## 2016-11-05 DIAGNOSIS — Z9889 Other specified postprocedural states: Secondary | ICD-10-CM | POA: Insufficient documentation

## 2016-11-05 DIAGNOSIS — M961 Postlaminectomy syndrome, not elsewhere classified: Secondary | ICD-10-CM | POA: Insufficient documentation

## 2016-11-05 DIAGNOSIS — Z4542 Encounter for adjustment and management of neuropacemaker (brain) (peripheral nerve) (spinal cord): Secondary | ICD-10-CM | POA: Insufficient documentation

## 2019-05-11 ENCOUNTER — Encounter (HOSPITAL_COMMUNITY): Payer: Self-pay

## 2019-05-11 ENCOUNTER — Other Ambulatory Visit: Payer: Self-pay

## 2019-05-11 ENCOUNTER — Ambulatory Visit (HOSPITAL_COMMUNITY)
Admission: EM | Admit: 2019-05-11 | Discharge: 2019-05-11 | Disposition: A | Discharge status: Home / Self Care | Payer: Medicare PPO | Attending: Family Medicine | Admitting: Family Medicine

## 2019-05-11 ENCOUNTER — Ambulatory Visit (INDEPENDENT_AMBULATORY_CARE_PROVIDER_SITE_OTHER): Payer: Medicare PPO

## 2019-05-11 DIAGNOSIS — M791 Myalgia, unspecified site: Secondary | ICD-10-CM | POA: Diagnosis not present

## 2019-05-11 DIAGNOSIS — W19XXXA Unspecified fall, initial encounter: Secondary | ICD-10-CM

## 2019-05-11 NOTE — ED Triage Notes (Signed)
Pt states he was unloading his riding lawn mower off his turck and the lawn mower fell off ramp and  on too him.   Pt cc left wrist arm , shoulder, hip  and he has a knot on his lower back.

## 2019-05-11 NOTE — ED Provider Notes (Signed)
[facility]    CSN: [id number] Arrival date & time: 05/11/19  1353     History   Chief Complaint Chief Complaint  Patient presents with  . Fall    HPI Jerry Chavez is a 53 y.o. male.   Patient is a 53 year old male past medical history of diabetes, high cholesterol, hypertension.  He presents today with multiple injuries after fall.  Reporting that yesterday he was riding a lumbar off the back of a trailer and he fell backwards and the lawnmower fell on top of him.  He is having left hand/wrist pain and swelling.  He is also having lower lumbar pain and left hip pain.  He believes he may have landed on the left side.  Denies any associated numbness, tingling, loss of bowel or bladder function.  Denies any trouble walking.  He has been using a cane.  He went to work this morning and staff recommended he be seen for injuries.  History of laminectomy and spinal cord stimulator.  ROS per HPI      Past Medical History:  Diagnosis Date  . Diabetes mellitus without complication (HCC)   . High cholesterol   . Hypertension     Patient Active Problem List   Diagnosis Date Noted  . Enteritis due to Rotavirus 02/09/2014  . Enteritis 11/04/2013  . Uncontrolled diabetes mellitus (HCC) 11/04/2013  . Hypertension 11/04/2013  . Hypokalemia 11/04/2013  . Sepsis (HCC) 11/04/2013  . Hyperlipemia 11/04/2013  . Morbid obesity (HCC) 11/04/2013  . Hyperglycemia 11/01/2013  . Neck strain 11/01/2013  . Headache 11/01/2013    Past Surgical History:  Procedure Laterality Date  . arm surgery     distal left arm ulnar and radial break  . BACK SURGERY     in 2004, stimulator installed  . TONSILLECTOMY         Home Medications    Prior to Admission medications   Medication Sig Start Date End Date Taking? Authorizing Provider  aspirin 81 MG chewable tablet Chew 81 mg by mouth daily.      [provider]  Cinnamon 500 MG TABS Take 1 tablet by mouth 2 (two) times  daily.      [provider]  cyclobenzaprine (FLEXERIL) 10 MG tablet Take 1 tablet (10 mg total) by mouth 2 (two) times daily as needed for muscle spasms. 11/02/13   Purvis SheffieldHarrison, Forrest, MD  fluticasone [facility](FLONASE) 50 MCG/ACT nasal spray Place 2 sprays into the nose daily as needed for allergies.     [provider]  ibuprofen (ADVIL,MOTRIN) 200 MG tablet Take 400 mg by mouth every 6 (six) hours as needed for moderate pain.    [provider]  insulin NPH Human (HUMULIN N) 100 UNIT/ML injection Inject 20 Units into the skin 2 (two) times daily before a meal. 11/08/13   Short, Thea SilversmithMackenzie, MD  insulin regular (HUMULIN R) 100 units/mL injection Inject 0.1 mLs (10 Units total) into the skin 3 (three) times daily before meals. 11/08/13   Renae FickleShort, Mackenzie, MD  metoCLOPramide (REGLAN) 10 MG tablet Take 1 tablet (10 mg total) by mouth every 6 (six) hours as needed for nausea. 09/03/13   Charlynne PanderYao, David Hsienta, MD  Multiple Vitamins-Minerals (MULTI FOR HIM 50+ PO) Take 1 tablet by mouth daily.      [provider]  omeprazole (PRILOSEC) 20 MG capsule Take 20 mg by mouth 2 (two) times daily before a meal.    [provider]  oxyCODONE (OXY IR/ROXICODONE)  5 MG immediate release tablet Take 5 mg by mouth 3 (three) times daily as needed for severe pain (pain).    [provider]  polyethylene glycol (MIRALAX / GLYCOLAX) packet Take 17 g by mouth daily as needed for mild constipation.    [provider]  saccharomyces boulardii (FLORASTOR) 250 MG capsule Take 1 capsule (250 mg total) by mouth 2 (two) times daily. 11/06/13   Janece Canterbury, MD  topiramate (TOPAMAX) 100 MG tablet Take 100 mg by mouth 2 (two) times daily.      [provider]    Family History Family History  Problem Relation Age of Onset  . Rheum arthritis Mother   . Diabetes Mother   . Hypertension Mother   . Cancer Father   . Diabetes Father   . Hypertension Father     Social  History Social History   Tobacco Use  . Smoking status: Never Smoker  . Smokeless tobacco: Never Used  Substance Use Topics  . Alcohol use: No  . Drug use: No     Allergies   Crestor [rosuvastatin calcium] and Lipitor [atorvastatin calcium]   Review of Systems Review of Systems   Physical Exam Triage Vital Signs ED Triage Vitals  Enc Vitals Group     BP 05/11/19 1428 (!) 148/95     Pulse Rate 05/11/19 1428 98     Resp 05/11/19 1428 18     Temp 05/11/19 1428 98.3 F (36.8 C)     Temp Source 05/11/19 1428 Oral     SpO2 05/11/19 1428 98 %     Weight 05/11/19 1424 247 lb (112 kg)     Height --      Head Circumference --      Peak Flow --      Pain Score 05/11/19 1424 8     Pain Loc --      Pain Edu? --      Excl. in [place]? --    No data found.  Updated Vital Signs BP (!) 148/95 (BP Location: Right Arm)   Pulse 98   Temp 98.3 F (36.8 C) (Oral)   Resp 18   Wt 247 lb (112 kg)   SpO2 98%   BMI 34.45 kg/m   Visual Acuity Right Eye Distance:   Left Eye Distance:   Bilateral Distance:    Right Eye Near:   Left Eye Near:    Bilateral Near:     Physical Exam Vitals signs and nursing note reviewed.  Constitutional:      General: He is not in acute distress.    Appearance: Normal appearance. He is not ill-appearing, toxic-appearing or diaphoretic.  HENT:     Head: Normocephalic and atraumatic.     Nose: Nose normal.  Eyes:     Conjunctiva/sclera: Conjunctivae normal.  Neck:     Musculoskeletal: Normal range of motion. No muscular tenderness.  Pulmonary:     Effort: Pulmonary effort is normal.  Musculoskeletal: Normal range of motion.       Back:     Comments: Tender to palpation with mild swelling.  Multiple scars from prior laminectomy and spinal cord stimulator  Left hand tenderness and swelling.  Able to wiggle digits. Good range of motion to left wrist.  Bony tenderness to left hip area.  No obvious bruising, swelling or deformities.  Able to  ambulate.   Skin:    General: Skin is warm and dry.  Neurological:     Mental Status:  He is alert.  Psychiatric:        Mood and Affect: Mood normal.      UC Treatments / Results  Labs (all labs ordered are listed, but only abnormal results are displayed) Labs Reviewed - No data to display  EKG   Radiology Dg Lumbar Spine Complete  Result Date: 05/11/2019 CLINICAL DATA:  LEFT hand, low back and LEFT hip pain due to fall, constant pain but hurts more when trying to stand from a sitting position, pain at first and second metacarpals especially with trying to pick things up EXAM: LUMBAR SPINE - COMPLETE 4+ VIEW COMPARISON:  CT abdomen pelvis 11/04/2013 FINDINGS: 5 non-rib-bearing lumbar vertebra. Intraspinal stimulator noted. Vertebral body heights maintained. Disc space narrowing with endplate spur formation at L5-S1 and at T12-L1. No fracture, subluxation, or bone destruction. SI joints preserved. IMPRESSION: Degenerative disc disease changes at T12-L1 and L5-S1. No acute osseous abnormalities. Electronically Signed   By: Ulyses SouthwardMark  Boles M.D.   On: 05/11/2019 15:57   Dg Hand Complete Left  Result Date: 05/11/2019 CLINICAL DATA:  LEFT hand, low back and LEFT hip pain due to fall, constant pain but hurts more when trying to stand from a sitting position, pain at first and second metacarpals especially with trying to pick things up EXAM: LEFT HAND - COMPLETE 3+ VIEW COMPARISON:  None FINDINGS: Osseous mineralization normal. Joint spaces preserved. No fracture, dislocation, or bone destruction. IMPRESSION: Normal exam. Electronically Signed   By: Ulyses SouthwardMark  Boles M.D.   On: 05/11/2019 15:59   Dg Hip Unilat With Pelvis 2-3 Views Left  Result Date: 05/11/2019 CLINICAL DATA:  LEFT hand, low back and LEFT hip pain due to fall, constant pain but hurts more when trying to stand from a sitting position, pain at first and second metacarpals especially with trying to pick things up EXAM: DG HIP (WITH OR  WITHOUT PELVIS) 2-3V LEFT COMPARISON:  Pelvic radiograph 11/10/2008 FINDINGS: Osseous mineralization normal. Hip and SI joint spaces preserved. No acute fracture, dislocation, or bone destruction. IMPRESSION: No acute osseous abnormalities. Electronically Signed   By: Ulyses SouthwardMark  Boles M.D.   On: 05/11/2019 15:58    Procedures Procedures (including critical care time)  Medications Ordered in UC Medications - No data to display  Initial Impression / Assessment and Plan / UC Course  I have reviewed the triage vital signs and the nursing notes.  Pertinent labs & imaging results that were available during my care of the patient were reviewed by me and considered in my medical decision making (see chart for details).     X-rays negative for any acute or bony abnormalities.  Most likely just muscle soreness from the fall.  Recommended rest, ice, elevate and monitor.  He can continue with his chronic pain medication as needed for pain. Follow up as needed for continued or worsening symptoms  Final Clinical Impressions(s) / UC Diagnoses   Final diagnoses:  Fall  Muscle pain     Discharge Instructions     Your x rays were normal Most likely soreness from the fall.  You can take your pain meds as needed for fall.  Rest, Ice, Elevate     ED Prescriptions    None     Controlled Substance Prescriptions [place] Controlled Substance Registry consulted? Not Applicable   Janace ArisBast, Kalai Baca A, NP 05/11/19 1611

## 2019-05-11 NOTE — Discharge Instructions (Signed)
Your x rays were normal Most likely soreness from the fall.  You can take your pain meds as needed for fall.  Rest, Ice, Elevate

## 2019-09-08 ENCOUNTER — Other Ambulatory Visit: Payer: Self-pay | Admitting: Internal Medicine

## 2019-09-08 DIAGNOSIS — M545 Low back pain, unspecified: Secondary | ICD-10-CM

## 2019-09-14 ENCOUNTER — Other Ambulatory Visit: Payer: Self-pay | Admitting: Internal Medicine

## 2019-09-15 ENCOUNTER — Other Ambulatory Visit: Payer: Medicare PPO

## 2019-09-22 ENCOUNTER — Other Ambulatory Visit: Payer: Self-pay | Admitting: Internal Medicine

## 2019-09-22 ENCOUNTER — Encounter: Payer: Self-pay | Admitting: Physician Assistant

## 2019-09-22 DIAGNOSIS — M545 Low back pain, unspecified: Secondary | ICD-10-CM

## 2019-10-04 ENCOUNTER — Ambulatory Visit
Admission: RE | Admit: 2019-10-04 | Discharge: 2019-10-04 | Disposition: A | Discharge status: Home / Self Care | Payer: Medicare PPO | Source: Ambulatory Visit | Attending: Internal Medicine | Admitting: Internal Medicine

## 2019-10-04 DIAGNOSIS — M545 Low back pain, unspecified: Secondary | ICD-10-CM

## 2019-10-06 DIAGNOSIS — R195 Other fecal abnormalities: Secondary | ICD-10-CM | POA: Insufficient documentation

## 2019-10-11 ENCOUNTER — Ambulatory Visit: Payer: Medicare PPO | Admitting: Physician Assistant

## 2021-10-19 IMAGING — CT CT L SPINE W/O CM
3 of 4 series · 12 of 33 positions shown, 14 images · non-contrast
Comparison: CT scan 11/03/2013

CLINICAL DATA: Low back pain for 1 month.

EXAM:
CT LUMBAR SPINE WITHOUT CONTRAST
TECHNIQUE: Multidetector CT imaging of the lumbar spine was performed without
intravenous contrast administration. Multiplanar CT image
reconstructions were also generated.

[Series 3: l-spine 2.00 br40 s3 lspine st · axial · 0.51mm/px · z∈[+1213,+1377]mm · 4 of 124 slices shown, 5 images]
[im 21/124  soft-tissue]
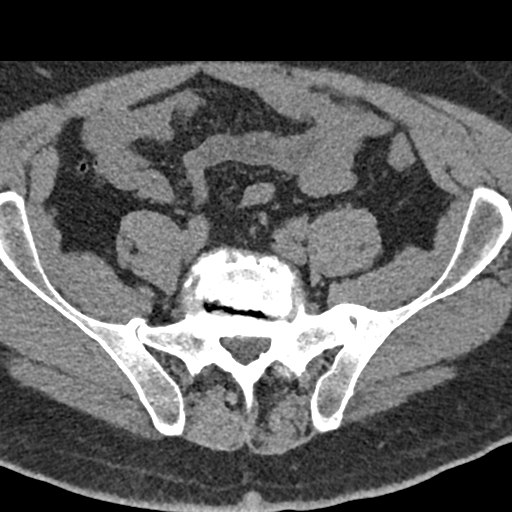
[im 21/124  bone]
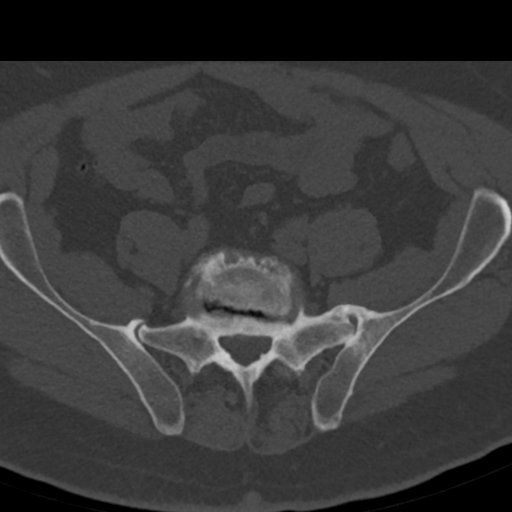
[im 42/124  bone]
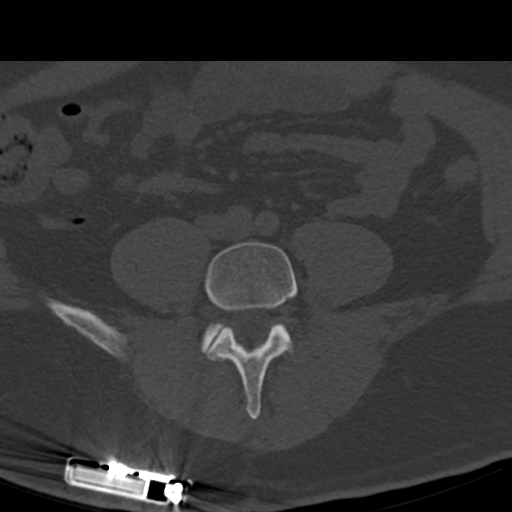
[im 83/124  bone]
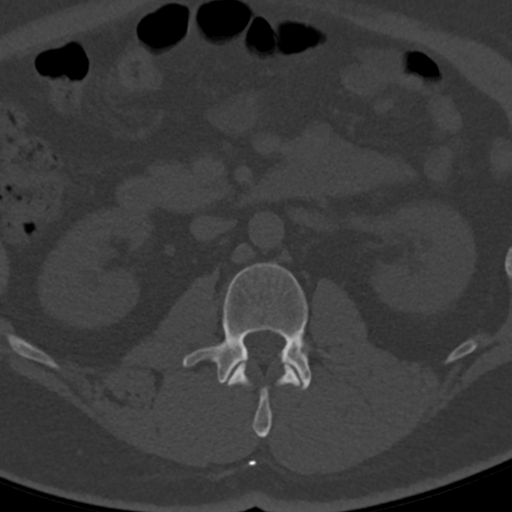
[im 103/124  bone]
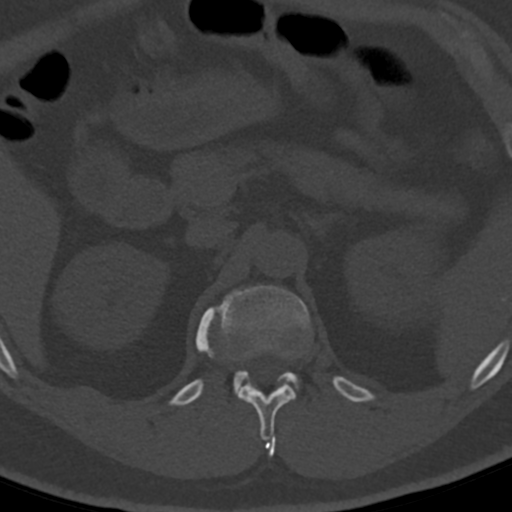

[Series 5: l-spine 2.00 br60 s3 sag bone · sagittal · 0.34mm/px · 5 of 87 slices shown, 6 images]
[im 29/87  bone]
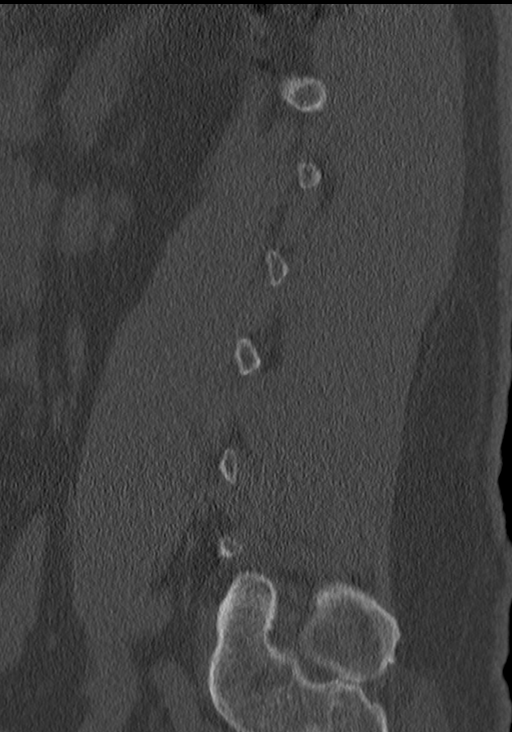
[im 36/87  bone]
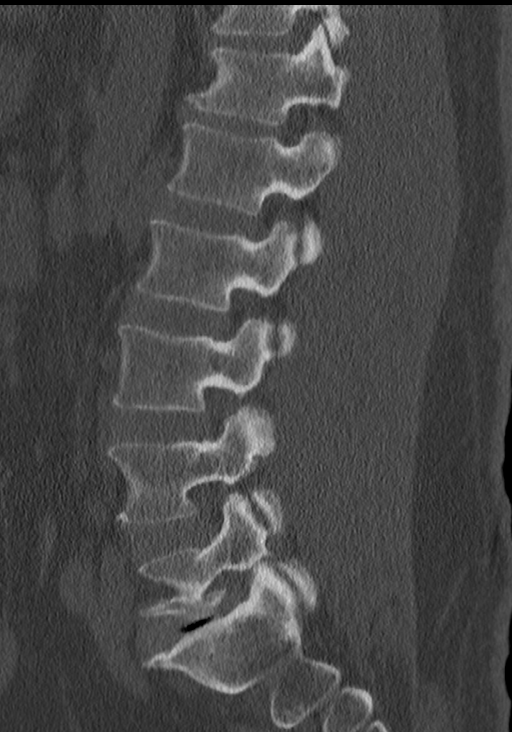
[im 44/87  soft-tissue]
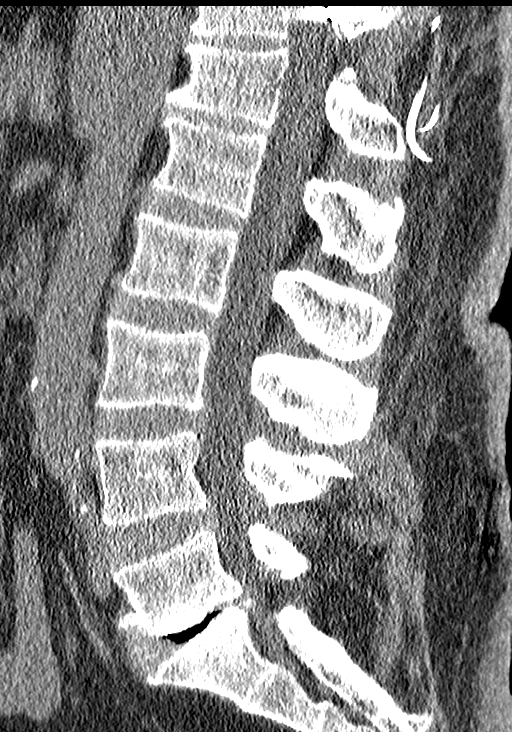
[im 44/87  bone]
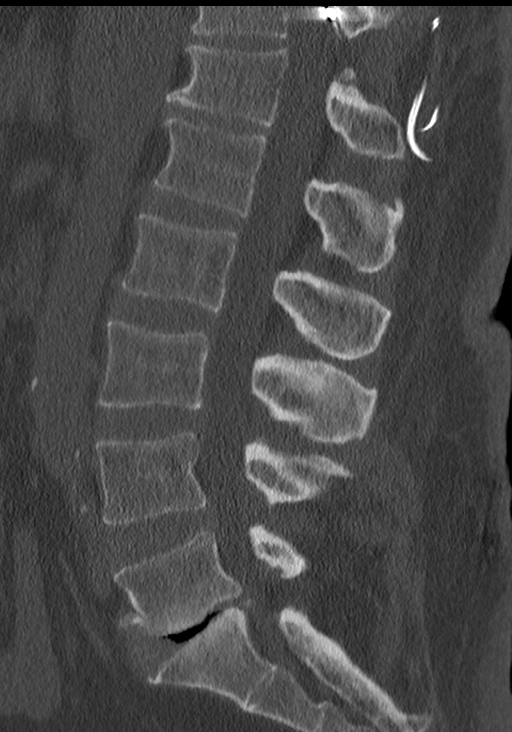
[im 51/87  bone]
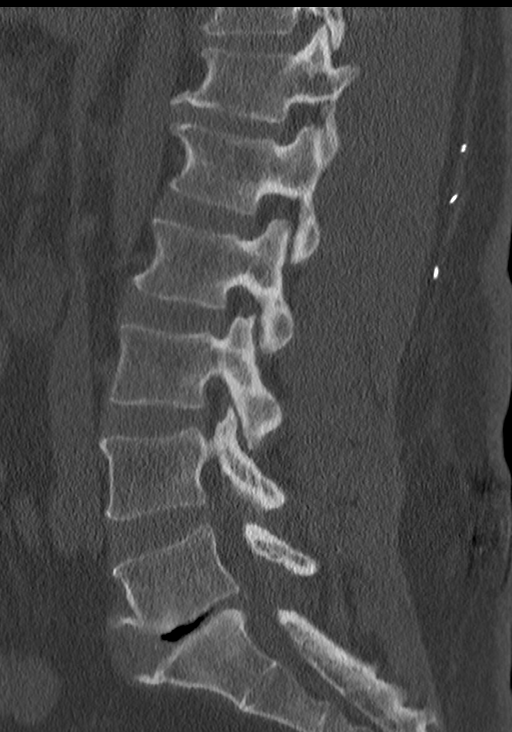
[im 58/87  bone]
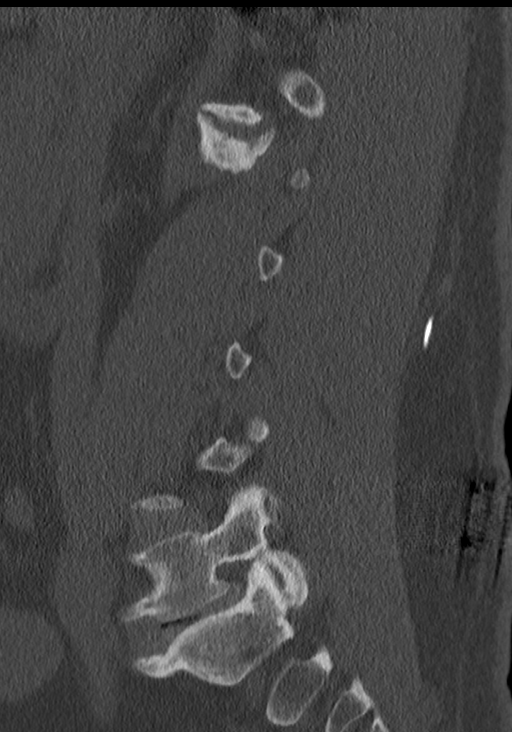

[Series 7: l-spine 2.00 br60 s3 cor bone · coronal · 0.34mm/px · 3 of 92 slices shown]
[im 19/92  bone]
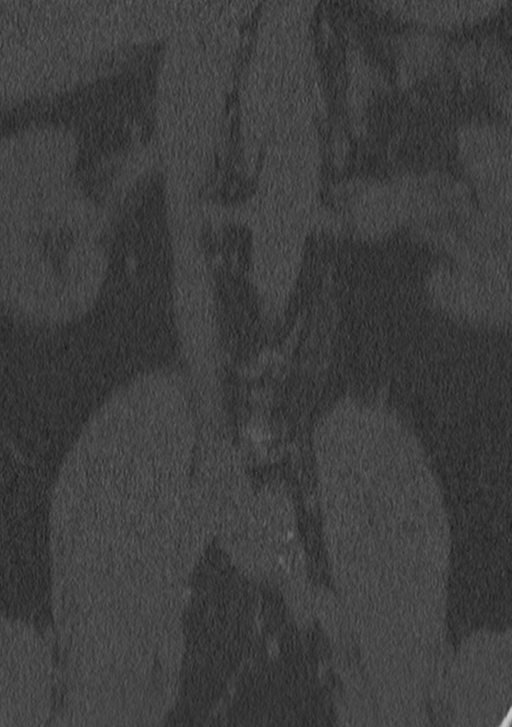
[im 37/92  bone]
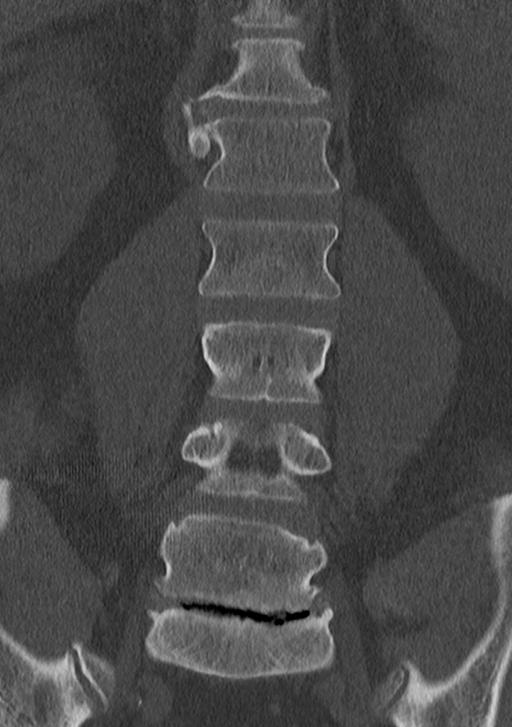
[im 55/92  bone]
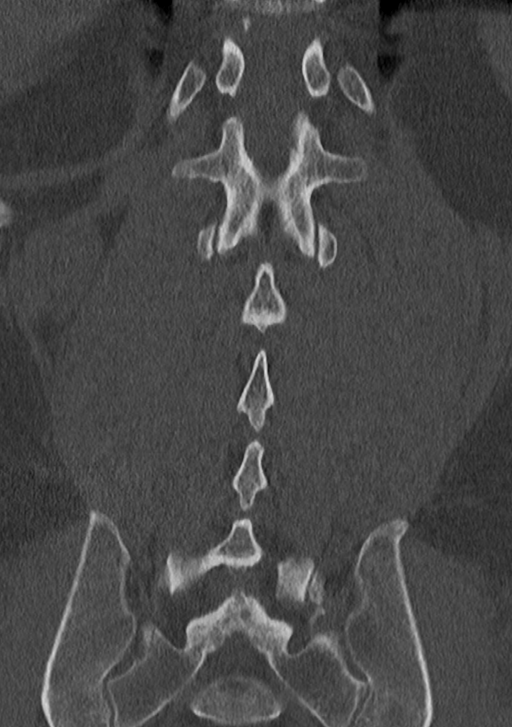

[12 of 33 positions shown; findings below may reference images not displayed]

FINDINGS: Segmentation: There are five lumbar type vertebral bodies. The last
full intervertebral disc space is labeled L5-S1.

Alignment: Normal

Vertebrae: No bone lesions or fractures.

Paraspinal and other soft tissues: No significant paraspinal or
retroperitoneal findings. Scattered atherosclerotic calcifications
involving the abdominal aorta and iliac arteries but no aneurysm.

Disc levels:

T12-L1: Mild annular bulge but no significant spinal or foraminal
stenosis.

L1-2: Mild facet disease but no disc protrusions or spinal stenosis.

L2-3: Mild facet disease but no significant spinal or foraminal
stenosis.

L3-4: Mild facet disease but no disc protrusions, spinal or
foraminal stenosis.

L4-5: Mild annular bulge but no focal disc protrusion, significant
spinal or foraminal stenosis.

L5-S1: Advanced degenerative disc disease with disc space narrowing,
osteophytic spurring and discogenic sclerosis. Vacuum disc
phenomenon noted. There is also moderate facet disease but no
significant spinal stenosis. Mild left foraminal stenosis
bilaterally mainly due to spurring changes.
IMPRESSION: 1. Advanced degenerative disc disease at L5-S1 with mild left
foraminal stenosis.
2. No significant disc protrusions, spinal or foraminal stenosis at
the other intervertebral disc space levels.

## 2022-02-06 ENCOUNTER — Other Ambulatory Visit (HOSPITAL_COMMUNITY): Payer: Self-pay

## 2022-02-06 MED ORDER — OXYCODONE HCL 10 MG PO TABS
10.0000 mg | ORAL_TABLET | Freq: Every day | ORAL | 0 refills | Status: DC
  Filled 2022-02-06: qty 150, 30d supply, fill #0

## 2022-03-05 ENCOUNTER — Other Ambulatory Visit (HOSPITAL_COMMUNITY): Payer: Self-pay

## 2022-03-05 MED ORDER — OXYCODONE HCL 10 MG PO TABS
10.0000 mg | ORAL_TABLET | Freq: Every day | ORAL | 0 refills | Status: AC
  Filled 2022-03-05: qty 150, 30d supply, fill #0
  Filled 2022-03-06: qty 50, 10d supply, fill #0
  Filled 2022-03-06: qty 100, 20d supply, fill #0

## 2022-03-05 MED ORDER — OXYCODONE HCL 10 MG PO TABS
ORAL_TABLET | ORAL | 0 refills | Status: AC
  Filled 2022-03-05: qty 150, 30d supply, fill #0

## 2022-03-06 ENCOUNTER — Other Ambulatory Visit (HOSPITAL_COMMUNITY): Payer: Self-pay

## 2022-04-02 ENCOUNTER — Other Ambulatory Visit (HOSPITAL_COMMUNITY): Payer: Self-pay

## 2022-04-02 MED ORDER — BACLOFEN 10 MG PO TABS
10.0000 mg | ORAL_TABLET | Freq: Three times a day (TID) | ORAL | 0 refills | Status: AC | PRN
  Filled 2022-04-02: qty 90, 30d supply, fill #0

## 2022-04-02 MED ORDER — PREGABALIN 200 MG PO CAPS
200.0000 mg | ORAL_CAPSULE | Freq: Two times a day (BID) | ORAL | 0 refills | Status: AC
  Filled 2022-04-02: qty 60, 30d supply, fill #0

## 2022-04-02 MED ORDER — OXYCODONE HCL 10 MG PO TABS
10.0000 mg | ORAL_TABLET | Freq: Every day | ORAL | 0 refills | Status: AC
  Filled 2022-04-02 – 2022-04-03 (×3): qty 150, 30d supply, fill #0

## 2022-04-03 ENCOUNTER — Other Ambulatory Visit (HOSPITAL_COMMUNITY): Payer: Self-pay

## 2022-04-24 ENCOUNTER — Other Ambulatory Visit (HOSPITAL_COMMUNITY): Payer: Self-pay

## 2022-04-30 ENCOUNTER — Other Ambulatory Visit (HOSPITAL_COMMUNITY): Payer: Self-pay

## 2022-04-30 MED ORDER — OXYCODONE HCL 10 MG PO TABS
10.0000 mg | ORAL_TABLET | Freq: Every day | ORAL | 0 refills | Status: AC | PRN
  Filled 2022-05-01: qty 150, 30d supply, fill #0

## 2022-05-01 ENCOUNTER — Other Ambulatory Visit (HOSPITAL_COMMUNITY): Payer: Self-pay

## 2022-05-02 ENCOUNTER — Other Ambulatory Visit (HOSPITAL_COMMUNITY): Payer: Self-pay

## 2022-05-29 ENCOUNTER — Other Ambulatory Visit (HOSPITAL_COMMUNITY): Payer: Self-pay

## 2022-05-29 MED ORDER — OXYCODONE HCL 10 MG PO TABS
10.0000 mg | ORAL_TABLET | Freq: Every day | ORAL | 0 refills | Status: AC | PRN
  Filled 2022-05-29: qty 150, 30d supply, fill #0

## 2022-05-30 ENCOUNTER — Other Ambulatory Visit (HOSPITAL_COMMUNITY): Payer: Self-pay

## 2022-06-26 ENCOUNTER — Other Ambulatory Visit (HOSPITAL_COMMUNITY): Payer: Self-pay

## 2022-06-26 MED ORDER — OXYCODONE HCL 10 MG PO TABS
10.0000 mg | ORAL_TABLET | Freq: Every day | ORAL | 0 refills | Status: AC
  Filled 2022-06-26: qty 150, 38d supply, fill #0
  Filled 2022-06-28: qty 135, 27d supply, fill #0
  Filled 2022-06-28: qty 15, 3d supply, fill #0

## 2022-06-28 ENCOUNTER — Other Ambulatory Visit (HOSPITAL_COMMUNITY): Payer: Self-pay

## 2022-07-26 ENCOUNTER — Other Ambulatory Visit (HOSPITAL_COMMUNITY): Payer: Self-pay

## 2022-07-26 MED ORDER — OXYCODONE HCL 10 MG PO TABS
10.0000 mg | ORAL_TABLET | Freq: Every day | ORAL | 0 refills | Status: AC
  Filled 2022-07-26: qty 150, 30d supply, fill #0

## 2022-08-23 ENCOUNTER — Other Ambulatory Visit (HOSPITAL_COMMUNITY): Payer: Self-pay

## 2022-08-23 MED ORDER — BACLOFEN 20 MG PO TABS
10.0000 mg | ORAL_TABLET | Freq: Three times a day (TID) | ORAL | 0 refills | Status: AC | PRN
  Filled 2022-08-23: qty 90, 30d supply, fill #0

## 2022-08-23 MED ORDER — OXYCODONE HCL 10 MG PO TABS
10.0000 mg | ORAL_TABLET | ORAL | 0 refills | Status: AC
  Filled 2022-08-23: qty 150, 30d supply, fill #0

## 2022-08-23 MED ORDER — PREGABALIN 200 MG PO CAPS
200.0000 mg | ORAL_CAPSULE | Freq: Two times a day (BID) | ORAL | 0 refills | Status: AC
  Filled 2022-08-23: qty 60, 30d supply, fill #0
